# Patient Record
Sex: Female | Born: 1970 | Race: Black or African American | Hispanic: No | Smoking: Never smoker
Health system: Southern US, Community
[De-identification: ages and names within clinical notes are randomized; demographics above are authoritative.]

## PROBLEM LIST (undated history)

## (undated) DIAGNOSIS — A630 Anogenital (venereal) warts: Secondary | ICD-10-CM

## (undated) DIAGNOSIS — E282 Polycystic ovarian syndrome: Secondary | ICD-10-CM

## (undated) DIAGNOSIS — J45909 Unspecified asthma, uncomplicated: Secondary | ICD-10-CM

## (undated) DIAGNOSIS — D649 Anemia, unspecified: Secondary | ICD-10-CM

## (undated) HISTORY — DX: Polycystic ovarian syndrome: E28.2

## (undated) HISTORY — PX: HYSTEROSCOPY: SHX211

## (undated) HISTORY — DX: Anemia, unspecified: D64.9

## (undated) HISTORY — DX: Unspecified asthma, uncomplicated: J45.909

## (undated) HISTORY — DX: Anogenital (venereal) warts: A63.0

---

## 2006-06-12 ENCOUNTER — Encounter: Admission: RE | Admit: 2006-06-12 | Discharge: 2006-09-10 | Payer: Self-pay | Admitting: Obstetrics & Gynecology

## 2007-01-04 ENCOUNTER — Other Ambulatory Visit: Admission: RE | Admit: 2007-01-04 | Discharge: 2007-01-04 | Payer: Self-pay | Admitting: Obstetrics and Gynecology

## 2007-11-19 ENCOUNTER — Other Ambulatory Visit: Admission: RE | Admit: 2007-11-19 | Discharge: 2007-11-19 | Payer: Self-pay | Admitting: Obstetrics & Gynecology

## 2008-07-11 ENCOUNTER — Other Ambulatory Visit: Admission: RE | Admit: 2008-07-11 | Discharge: 2008-07-11 | Payer: Self-pay | Admitting: Obstetrics and Gynecology

## 2008-07-12 ENCOUNTER — Encounter: Payer: Self-pay | Admitting: Internal Medicine

## 2008-09-22 DIAGNOSIS — F329 Major depressive disorder, single episode, unspecified: Secondary | ICD-10-CM

## 2008-09-22 DIAGNOSIS — M545 Low back pain, unspecified: Secondary | ICD-10-CM | POA: Insufficient documentation

## 2008-09-22 DIAGNOSIS — F411 Generalized anxiety disorder: Secondary | ICD-10-CM | POA: Insufficient documentation

## 2008-09-24 ENCOUNTER — Ambulatory Visit: Payer: Self-pay | Admitting: Internal Medicine

## 2008-09-24 DIAGNOSIS — K921 Melena: Secondary | ICD-10-CM

## 2008-10-10 ENCOUNTER — Ambulatory Visit: Payer: Self-pay | Admitting: Internal Medicine

## 2010-05-12 ENCOUNTER — Encounter
Admission: RE | Admit: 2010-05-12 | Discharge: 2010-05-12 | Payer: Self-pay | Source: Home / Self Care | Admitting: Obstetrics & Gynecology

## 2013-02-16 ENCOUNTER — Ambulatory Visit (INDEPENDENT_AMBULATORY_CARE_PROVIDER_SITE_OTHER): Payer: BC Managed Care – PPO | Admitting: Family Medicine

## 2013-02-16 VITALS — BP 112/70 | HR 60 | Temp 99.5°F | Resp 16 | Ht 67.0 in | Wt 240.0 lb

## 2013-02-16 DIAGNOSIS — B373 Candidiasis of vulva and vagina: Secondary | ICD-10-CM

## 2013-02-16 DIAGNOSIS — Z131 Encounter for screening for diabetes mellitus: Secondary | ICD-10-CM

## 2013-02-16 DIAGNOSIS — R21 Rash and other nonspecific skin eruption: Secondary | ICD-10-CM

## 2013-02-16 LAB — POCT WET PREP WITH KOH: Yeast Wet Prep HPF POC: NEGATIVE

## 2013-02-16 LAB — GLUCOSE, POCT (MANUAL RESULT ENTRY): POC Glucose: 119 mg/dl — AB (ref 70–99)

## 2013-02-16 MED ORDER — FLUCONAZOLE 100 MG PO TABS: ORAL_TABLET | ORAL | Status: DC

## 2013-02-16 MED ORDER — NYSTATIN 100000 UNIT/GM EX CREA: TOPICAL_CREAM | Freq: Two times a day (BID) | CUTANEOUS | Status: DC

## 2013-02-16 NOTE — Progress Notes (Signed)
   [address]   [place], [place]  [address]   [phone]  Subjective:    Patient ID: Kim Webb, female    DOB: January 28, 1971, 42 y.o.   MRN: [id number]  Rash Pertinent negatives include no fatigue or fever.   This 42 y.o. female presents for evaluation of pruritic rash inner thighs.  Using Gold Bond to keep odor away for the past 1.5 weeks.  Onset of itching three days ago; changed to Gold Bond medicated cream with some improvement.  No vaginal discharge; married.  Worried about genital warts and herpes.    Review of Systems  Constitutional: Negative for fever, chills, diaphoresis and fatigue.  Genitourinary: Negative for vaginal bleeding, vaginal discharge, genital sores and vaginal pain.  Skin: Positive for rash.   History reviewed. No pertinent past medical history. History reviewed. No pertinent past surgical history. No Known Allergies No current outpatient prescriptions on file prior to visit.   No current facility-administered medications on file prior to visit.       Objective:   Physical Exam  Nursing note and vitals reviewed. Constitutional: She is oriented to person, place, and time.  Genitourinary:    There is rash on the right labia. There is no tenderness, lesion or injury on the right labia. There is rash on the left labia. There is no tenderness, lesion or injury on the left labia. No erythema around the vagina. No foreign body around the vagina. Vaginal discharge found.  Hypopigmented rash B introitus including labia majora; scaling slightly; +white thick discharge at vaginal opening. No vesicles or pustules along affected area.  +follicular prominence B inner thighs of unaffected area of rash.  Neurological: She is alert and oriented to person, place, and time.   Results for orders placed in visit on 02/16/13  POCT WET PREP WITH KOH      Result Value Range   Trichomonas, UA Negative     Clue Cells Wet Prep HPF POC tntc     Epithelial Wet Prep HPF POC  3-6     Yeast Wet Prep HPF POC neg     Bacteria Wet Prep HPF POC 3+     RBC Wet Prep HPF POC 2-3     WBC Wet Prep HPF POC 1-2     KOH Prep POC Negative    POCT SKIN KOH      Result Value Range   Skin KOH, POC Positive    GLUCOSE, POCT (MANUAL RESULT ENTRY)      Result Value Range   POC Glucose 119 (*) 70 - 99 mg/dl       Assessment & Plan:  Rash and nonspecific skin eruption - Plan: POCT Wet Prep with KOH, POCT Skin KOH  Candidiasis of vulva and vagina - Plan: POCT glucose (manual entry)  Screening for diabetes mellitus - Plan: POCT glucose (manual entry)  1.  Candidiasis vulvovaginal:  New.  Rx for Diflucan and Nystatin cream provided.  No evidence of genital herpes or genital warts.  Normal random glucose.  Meds ordered this encounter  Medications  . fluconazole (DIFLUCAN) 100 MG tablet    Sig: Two tablets daily x 1 day then one tablet daily x 6 days    Dispense:  8 tablet    Refill:  0  . nystatin cream (MYCOSTATIN)    Sig: Apply topically 2 (two) times daily.    Dispense:  30 g    Refill:  1

## 2013-04-02 ENCOUNTER — Ambulatory Visit (INDEPENDENT_AMBULATORY_CARE_PROVIDER_SITE_OTHER): Payer: BC Managed Care – PPO | Admitting: Family Medicine

## 2013-04-02 VITALS — BP 118/80 | HR 61 | Temp 98.6°F | Resp 16 | Ht 66.5 in | Wt 237.0 lb

## 2013-04-02 DIAGNOSIS — M79609 Pain in unspecified limb: Secondary | ICD-10-CM

## 2013-04-02 DIAGNOSIS — L02419 Cutaneous abscess of limb, unspecified: Secondary | ICD-10-CM

## 2013-04-02 DIAGNOSIS — L03115 Cellulitis of right lower limb: Secondary | ICD-10-CM

## 2013-04-02 MED ORDER — DOXYCYCLINE HYCLATE 100 MG PO TABS: 100.0000 mg | ORAL_TABLET | Freq: Two times a day (BID) | ORAL | Status: DC

## 2013-04-02 NOTE — Progress Notes (Signed)
Subjective:    Patient ID: Kim Webb, female    DOB: [birth year], 42 y.o.   MRN: [id number] This chart was scribed for Kim Staggers, MD by Kim Webb, ED Scribe. This patient was seen in room 3 and the patient's care was started at 9:13 PM.  Chief Complaint  Patient presents with  . Cellulitis    Right thigh since last night   HPI HPI Comments: Kim Webb is a 42 y.o. female who presents to the [facility] complaining of cellulitis with redness to the right thigh since last night. She states she was standing looking in the mirror last night when she noticed a bulging area and became concerned. The area became mildly tender this morning. She did not notice any soreness until last night. Her son has a staph infection. She has not tried any creams or medications. She denies rash anywhere else. She denies fever, chills, recent illness.   Patient Active Problem List   Diagnosis Date Noted  . HEMOCCULT POSITIVE STOOL 09/24/2008  . ANXIETY 09/22/2008  . DEPRESSION 09/22/2008  . BACK PAIN, LUMBAR 09/22/2008   History reviewed. No pertinent past medical history. History reviewed. No pertinent past surgical history. No Known Allergies Prior to Admission medications   Not on File   History  Substance Use Topics  . Smoking status: Never Smoker   . Smokeless tobacco: Not on file  . Alcohol Use: No     Review of Systems  Constitutional: Negative for fever and chills.  Skin: Negative for rash.       Objective:   Physical Exam  Nursing note and vitals reviewed. Constitutional: She is oriented to person, place, and time. She appears well-developed and well-nourished. No distress.  HENT:  Head: Normocephalic and atraumatic.  Eyes: EOM are normal.  Cardiovascular: Normal rate, regular rhythm, normal heart sounds and intact distal pulses.   Pulmonary/Chest: Effort normal and breath sounds normal. No respiratory distress. She has no wheezes. She has  no rales.  Neurological: She is alert and oriented to person, place, and time.  Skin: Skin is warm and dry. There is erythema.  Medial right thigh there is a slight hyperpigmentation, induration of approx 3-4cm with one central pustule and a central fluctuance of approx 1 cm, 15 cm of faint erythema around indurated area. No palpable tenderness or lymphadenopathy of right inguinal area.  Psychiatric: She has a normal mood and affect. Her behavior is normal.    Filed Vitals:   04/02/13 2018  BP: 118/80  Pulse: 61  Temp: 98.6 F (37 C)  Resp: 16  Height: 5' 6.5" (1.689 m)  Weight: 237 lb (107.502 kg)  SpO2: 100%       Assessment & Plan:   Kim Webb is a 42 y.o. female Cellulitis of leg, right - Plan: doxycycline (VIBRA-TABS) 100 MG tablet, Wound culture  Minimal d/c obtained with I and D. Start doxycycline, wound cx pending, wound care recheck in 3 days. Hot compresses b/t now and then. rtc precautions.  Meds ordered this encounter  Medications  . doxycycline (VIBRA-TABS) 100 MG tablet    Sig: Take 1 tablet (100 mg total) by mouth 2 (two) times daily.    Dispense:  20 tablet    Refill:  0   Patient Instructions  Hot compresses to affected area 4-5 times per day, start antibiotic, and recheck Friday morning with Kim Webb.  Change the dressing once daily (leave the packing in place).  Change the dressing more  frequently if it becomes wet or saturated. Return to the clinic or go to the nearest emergency room if any of your symptoms worsen or new symptoms occur. Cellulitis Cellulitis is an infection of the skin and the tissue beneath it. The infected area is usually red and tender. Cellulitis occurs most often in the arms and lower legs.  CAUSES  Cellulitis is caused by bacteria that enter the skin through cracks or cuts in the skin. The most common types of bacteria that cause cellulitis are Staphylococcus and Streptococcus. SYMPTOMS   Redness and  warmth.  Swelling.  Tenderness or pain.  Fever. DIAGNOSIS  Your caregiver can usually determine what is wrong based on a physical exam. Blood tests may also be done. TREATMENT  Treatment usually involves taking an antibiotic medicine. HOME CARE INSTRUCTIONS   Take your antibiotics as directed. Finish them even if you start to feel better.  Keep the infected arm or leg elevated to reduce swelling.  Apply a warm cloth to the affected area up to 4 times per day to relieve pain.  Only take over-the-counter or prescription medicines for pain, discomfort, or fever as directed by your caregiver.  Keep all follow-up appointments as directed by your caregiver. SEEK MEDICAL CARE IF:   You notice red streaks coming from the infected area.  Your red area gets larger or turns dark in color.  Your bone or joint underneath the infected area becomes painful after the skin has healed.  Your infection returns in the same area or another area.  You notice a swollen bump in the infected area.  You develop new symptoms. SEEK IMMEDIATE MEDICAL CARE IF:   You have a fever.  You feel very sleepy.  You develop vomiting or diarrhea.  You have a general ill feeling (malaise) with muscle aches and pains. MAKE SURE YOU:   Understand these instructions.  Will watch your condition.  Will get help right away if you are not doing well or get worse. Document Released: 03/02/2005 Document Revised: 11/22/2011 Document Reviewed: 08/08/2011 [facility] Patient Information 2014 [place], [place].      I personally performed the services described in this documentation, which was scribed in my presence. The recorded information has been reviewed and considered, and addended by me as needed.

## 2013-04-02 NOTE — Patient Instructions (Addendum)
Hot compresses to affected area 4-5 times per day, start antibiotic, and recheck Friday morning with Dr. Neva Seat.  Change the dressing once daily (leave the packing in place).  Change the dressing more frequently if it becomes wet or saturated. Return to the clinic or go to the nearest emergency room if any of your symptoms worsen or new symptoms occur. Cellulitis Cellulitis is an infection of the skin and the tissue beneath it. The infected area is usually red and tender. Cellulitis occurs most often in the arms and lower legs.  CAUSES  Cellulitis is caused by bacteria that enter the skin through cracks or cuts in the skin. The most common types of bacteria that cause cellulitis are Staphylococcus and Streptococcus. SYMPTOMS   Redness and warmth.  Swelling.  Tenderness or pain.  Fever. DIAGNOSIS  Your caregiver can usually determine what is wrong based on a physical exam. Blood tests may also be done. TREATMENT  Treatment usually involves taking an antibiotic medicine. HOME CARE INSTRUCTIONS   Take your antibiotics as directed. Finish them even if you start to feel better.  Keep the infected arm or leg elevated to reduce swelling.  Apply a warm cloth to the affected area up to 4 times per day to relieve pain.  Only take over-the-counter or prescription medicines for pain, discomfort, or fever as directed by your caregiver.  Keep all follow-up appointments as directed by your caregiver. SEEK MEDICAL CARE IF:   You notice red streaks coming from the infected area.  Your red area gets larger or turns dark in color.  Your bone or joint underneath the infected area becomes painful after the skin has healed.  Your infection returns in the same area or another area.  You notice a swollen bump in the infected area.  You develop new symptoms. SEEK IMMEDIATE MEDICAL CARE IF:   You have a fever.  You feel very sleepy.  You develop vomiting or diarrhea.  You have a general ill  feeling (malaise) with muscle aches and pains. MAKE SURE YOU:   Understand these instructions.  Will watch your condition.  Will get help right away if you are not doing well or get worse. Document Released: 03/02/2005 Document Revised: 11/22/2011 Document Reviewed: 08/08/2011 [facility] Patient Information 2014 [place], [place].

## 2013-04-02 NOTE — Progress Notes (Signed)
Procedure Note: Verbal consent obtained.  Local anesthesia with 2 cc 2% lidocaine.  Betadine prep.  Incision with 11 blade.  Minimal purulence expressed.  Culture collected. Wound irrigated with remaining anesthetic.  Packed with 1/4 inch plain packing.  Cleansed and dressed.  Discussed wound care.

## 2013-04-05 ENCOUNTER — Ambulatory Visit (INDEPENDENT_AMBULATORY_CARE_PROVIDER_SITE_OTHER): Payer: BC Managed Care – PPO | Admitting: Family Medicine

## 2013-04-05 VITALS — BP 120/68 | HR 74 | Temp 98.9°F | Resp 16 | Ht 66.25 in | Wt 234.6 lb

## 2013-04-05 DIAGNOSIS — L02419 Cutaneous abscess of limb, unspecified: Secondary | ICD-10-CM

## 2013-04-05 LAB — WOUND CULTURE: Gram Stain: NONE SEEN

## 2013-04-05 NOTE — Progress Notes (Signed)
  Subjective:    Patient ID: Kim Webb, female    DOB: July 11, 1970, 42 y.o.   MRN: [id number]  HPI  Kim Webb is a 42 y.o. female  Here for follow up R thigh cellulitis - s/p I and D 04/02/13, and Rx doxycycline 100mg  BID. Wound cx below.   Results for orders placed in visit on 04/02/13  WOUND CULTURE      Result Value Range   Gram Stain Rare     Gram Stain WBC present-predominately PMN     Gram Stain No Squamous Epithelial Cells Seen     Gram Stain Few GRAM POSITIVE COCCI IN PAIRS In Clusters     Organism ID, Bacteria Multiple Organisms Present,None Predominant     Feels like is getting better. Changing bandage multiple times as won't stick. No fever, no discharge. No apparent redness. Feels well.    Review of Systems  Constitutional: Negative for fever, chills and fatigue.  Gastrointestinal: Negative for nausea, vomiting, diarrhea and blood in stool.  Skin: Negative for color change, rash and wound.       Objective:   Physical Exam  Vitals reviewed. Constitutional: She is oriented to person, place, and time. She appears well-developed and well-nourished. No distress.  Pulmonary/Chest: Effort normal.  Musculoskeletal:       Legs: Neurological: She is alert and oriented to person, place, and time.  Skin: Skin is warm and dry.  Psychiatric: She has a normal mood and affect. Her behavior is normal.       Assessment & Plan:  Kim Webb is a 42 y.o. female Cellulitis and abscess of leg Improving.  No dominant bacteria on culture. Finish antibiotic, warm compresses and rtc precautions discussed.  If not continuing to improve in next 4-5 days - rtc for recheck. rtc precautions.   No orders of the defined types were placed in this encounter.   Patient Instructions  Continue warm compresses few times per day, complete course of antibiotic, and if not continuing to improve (or worsening) in next 4-5 days - recheck for repeat evaluation.

## 2013-04-05 NOTE — Patient Instructions (Signed)
Continue warm compresses few times per day, complete course of antibiotic, and if not continuing to improve (or worsening) in next 4-5 days - recheck for repeat evaluation.

## 2013-06-26 ENCOUNTER — Telehealth: Payer: Self-pay | Admitting: Obstetrics & Gynecology

## 2013-06-26 NOTE — Telephone Encounter (Signed)
Patient wants to see dr Hyacinth Meekermiller about her history with BV wants to get checked to see if anything is going on right now. And talk to her about it to see if that is what it really is. Wants to come in Monday if possible Call work and ask for her. Dropped cell in water.

## 2013-06-26 NOTE — Telephone Encounter (Signed)
Spoke with patient. She was currently under treatment for BV but did not finish oral flagyl as she feels it was causing her headaches. Now feels she may have developed a yeast infection. Office visit with Dr. Hyacinth MeekerMiller scheduled at patient's request for Monday 07/01/13.  Routing to provider for final review. Patient agreeable to disposition. Will close encounter

## 2013-06-28 ENCOUNTER — Encounter: Payer: Self-pay | Admitting: Obstetrics & Gynecology

## 2013-07-01 ENCOUNTER — Ambulatory Visit (INDEPENDENT_AMBULATORY_CARE_PROVIDER_SITE_OTHER): Payer: BC Managed Care – PPO | Admitting: Obstetrics & Gynecology

## 2013-07-01 ENCOUNTER — Other Ambulatory Visit: Payer: Self-pay | Admitting: Obstetrics & Gynecology

## 2013-07-01 ENCOUNTER — Ambulatory Visit: Payer: BC Managed Care – PPO | Admitting: Obstetrics & Gynecology

## 2013-07-01 ENCOUNTER — Ambulatory Visit
Admission: RE | Admit: 2013-07-01 | Discharge: 2013-07-01 | Disposition: A | Discharge status: Home / Self Care | Payer: BC Managed Care – PPO | Source: Ambulatory Visit | Attending: Obstetrics & Gynecology | Admitting: Obstetrics & Gynecology

## 2013-07-01 VITALS — BP 122/76 | HR 60 | Resp 16 | Ht 67.5 in | Wt 242.8 lb

## 2013-07-01 DIAGNOSIS — B373 Candidiasis of vulva and vagina: Secondary | ICD-10-CM

## 2013-07-01 DIAGNOSIS — N898 Other specified noninflammatory disorders of vagina: Secondary | ICD-10-CM

## 2013-07-01 DIAGNOSIS — Z202 Contact with and (suspected) exposure to infections with a predominantly sexual mode of transmission: Secondary | ICD-10-CM

## 2013-07-01 DIAGNOSIS — B3731 Acute candidiasis of vulva and vagina: Secondary | ICD-10-CM

## 2013-07-01 DIAGNOSIS — Z1231 Encounter for screening mammogram for malignant neoplasm of breast: Secondary | ICD-10-CM

## 2013-07-01 MED ORDER — FLUCONAZOLE 150 MG PO TABS: 150.0000 mg | ORAL_TABLET | Freq: Once | ORAL | Status: DC

## 2013-07-01 NOTE — Progress Notes (Signed)
Subjective:     Patient ID: Kim Webb, female   DOB: 09/24/1970, 43 y.o.   MRN: [id number]  HPI 43 yo G1 MAAF here for discussion of recurrent vaginal discharge and odor.  Patient has 601 1/43 year old son.  So happy with this.  Husband with indiscretions in marriage.  She explains situation to me in detail that I don't feel she needs to share but she wants to explain why she is still in the marriage.  I explain to her this is ultimately her decision and not a decision she needs to justify.  She does not feel she needs therapy at this time.  Working together with spouse to dealt with issues.  She does report she is very anxious about this and wants to have STD testing done.  Very nervous.  Has been treated for yeast and BV.  Given flagyl for 10 days but made her sick so she only took 7 days of it.  Wonders if she had enough.  Cycles regular.  Mild odor.  No pelvic pain.  No fever.  Review of Systems  All other systems reviewed and are negative.       Objective:   Physical Exam  Constitutional: She is oriented to person, place, and time. She appears well-developed and well-nourished.  Abdominal: Soft. Bowel sounds are normal.  Genitourinary: Uterus normal. There is no rash or tenderness on the right labia. There is no rash or tenderness on the left labia. Cervix exhibits discharge. Cervix exhibits no motion tenderness and no friability. Right adnexum displays no mass and no tenderness. Left adnexum displays no mass and no tenderness. No tenderness or bleeding around the vagina. No foreign body around the vagina. Vaginal discharge found.  Musculoskeletal: Normal range of motion.  Lymphadenopathy:       Right: No inguinal adenopathy present.       Left: No inguinal adenopathy present.  Neurological: She is alert and oriented to person, place, and time.  Skin: Skin is warm and dry.  Psychiatric: She has a normal mood and affect.   Wet smear:  Ph 5.0.  Saline with - trich, +1WBCs, KOH with +  yeast, neg Whiff    Assessment:     Vaginal discharge Desires STD testing     Plan:     HIV, RPR, Hep b, GC/Chl today Diflucan 150mg  po x 1 and repeat 72 hours.  Rx to pharmacy.      ~25 minutes spent with patient >50% of time was in face to face discussion of above.  Lengthy visit as patient explained her situation to me and felt she needed to share so I listened.

## 2013-07-02 LAB — STD PANEL
HEP B S AG: NEGATIVE
HIV: NONREACTIVE

## 2013-07-02 LAB — GC/CHLAMYDIA PROBE AMP, URINE
CHLAMYDIA, SWAB/URINE, PCR: NEGATIVE
GC PROBE AMP, URINE: NEGATIVE

## 2013-07-05 ENCOUNTER — Encounter: Payer: Self-pay | Admitting: Obstetrics & Gynecology

## 2013-07-05 ENCOUNTER — Telehealth: Payer: Self-pay | Admitting: Emergency Medicine

## 2013-07-05 NOTE — Telephone Encounter (Signed)
Message copied by Joeseph AmorFAST, Parissa Chiao L on Fri Jul 05, 2013  9:53 AM ------      Message from: Jerene BearsMILLER, MARY S      Created: Wed Jul 03, 2013  3:51 PM       Please inform HIV, RPR, Hep B, GC, and Chl all neg.  Thank you.  Husband has extra-marital relationship and this was for pt's reassurance. ------

## 2013-07-05 NOTE — Telephone Encounter (Signed)
Spoke with patient and message from Dr. Hyacinth MeekerMiller given. Patient very grateful for phone call.  Patient states that she took both doses of diflucan and was wearing a girdle and noticed itching again. Wanted to know if she should try something over the counter. Advised that she just completed the two tablets of diflucan yesterday, that it may take some time to continue to work, but to call back if not improved. Patient agreeable.  Routing to provider for final review. Patient agreeable to disposition. Will close encounter

## 2013-07-08 ENCOUNTER — Encounter: Payer: Self-pay | Admitting: Obstetrics & Gynecology

## 2013-08-19 ENCOUNTER — Ambulatory Visit (INDEPENDENT_AMBULATORY_CARE_PROVIDER_SITE_OTHER): Payer: BC Managed Care – PPO | Admitting: Internal Medicine

## 2013-08-19 VITALS — BP 120/78 | HR 68 | Temp 99.2°F | Resp 18 | Ht 65.5 in | Wt 240.6 lb

## 2013-08-19 DIAGNOSIS — N898 Other specified noninflammatory disorders of vagina: Secondary | ICD-10-CM

## 2013-08-19 LAB — GLUCOSE, POCT (MANUAL RESULT ENTRY): POC GLUCOSE: 105 mg/dL — AB (ref 70–99)

## 2013-08-19 LAB — POCT WET PREP WITH KOH
KOH PREP POC: NEGATIVE
RBC WET PREP PER HPF POC: NEGATIVE
Trichomonas, UA: NEGATIVE
Yeast Wet Prep HPF POC: NEGATIVE

## 2013-08-19 MED ORDER — FLUCONAZOLE 150 MG PO TABS: 150.0000 mg | ORAL_TABLET | Freq: Once | ORAL | Status: DC

## 2013-08-19 NOTE — Progress Notes (Signed)
   Subjective:    Patient ID: Kim Webb, female    DOB: [birth year], 43 y.o.   MRN: [id number]  HPI 43 year old woman with cc of vaginal discharge and itch and wants to be checked for std. No known std exposure although her sexual partner is not always loyal. Uses condoms. Has been checked for std within the year including negative hiv test. Has had a recent treatment for bacterial vaginosis with flagyl 2 weeks ago was [place]. No abdominal or pelvic pain. No fever. Not pregnant.    Review of Systems  Genitourinary: Positive for vaginal discharge.  All other systems reviewed and are negative.       Objective:   Physical Exam  Nursing note and vitals reviewed. Constitutional: She is oriented to person, place, and time. She appears well-developed and well-nourished.  HENT:  Head: Normocephalic and atraumatic.  Eyes: Conjunctivae and EOM are normal. Pupils are equal, round, and reactive to light.  Neck: Normal range of motion. Neck supple.  Cardiovascular: Normal rate, regular rhythm, normal heart sounds and intact distal pulses.   Pulmonary/Chest: Effort normal and breath sounds normal.  Abdominal: Soft. Bowel sounds are normal.  Genitourinary: Vaginal discharge found.  Gyn exam normal introitus with vaginal discharge white thick like yeast no cervical motion tenderness no adnexal tenderness.   Musculoskeletal: Normal range of motion.  Neurological: She is oriented to person, place, and time.  Skin: Skin is warm and dry.  Psychiatric: She has a normal mood and affect. Her behavior is normal. Judgment and thought content normal.   Results for orders placed in visit on 08/19/13  POCT WET PREP WITH KOH      Result Value Ref Range   Trichomonas, UA Negative     Clue Cells Wet Prep HPF POC 0-2     Epithelial Wet Prep HPF POC 3-5     Yeast Wet Prep HPF POC neg     Bacteria Wet Prep HPF POC 1+     RBC Wet Prep HPF POC neg     WBC Wet Prep HPF POC 2-4      KOH Prep POC Negative     negative yeast however appears as yeast infection and is symptomatic . Will rx with diflucan.await the results for chlamydia and gc.       Assessment & Plan:  Diflucan as directed. Use condoms. fup as directed. gc and chlamydia sent to lab.

## 2013-08-19 NOTE — Progress Notes (Signed)
   Subjective:    Patient ID: Kim Webb, female    DOB: 07/15/1970, 43 y.o.   MRN: [id number]  HPI    Review of Systems     Objective:   Physical Exam   Results for orders placed in visit on 08/19/13  POCT WET PREP WITH KOH      Result Value Ref Range   Trichomonas, UA Negative     Clue Cells Wet Prep HPF POC 0-2     Epithelial Wet Prep HPF POC 3-5     Yeast Wet Prep HPF POC neg     Bacteria Wet Prep HPF POC 1+     RBC Wet Prep HPF POC neg     WBC Wet Prep HPF POC 2-4     KOH Prep POC Negative    GLUCOSE, POCT (MANUAL RESULT ENTRY)      Result Value Ref Range   POC Glucose 105 (*) 70 - 99 mg/dl      accucheck is [id number] nonfasting Assessment & Plan:

## 2013-08-19 NOTE — Addendum Note (Signed)
Addended by: Sherlyn HayGOTTLIEB, Siris Hoos L on: 08/19/2013 09:19 PM   Modules accepted: Orders

## 2013-08-19 NOTE — Patient Instructions (Signed)
Take meds as directed. The rest of the tests for std have been sent out to the lab and will take 3 days to result. Use condoms. If your symptoms worsen or if you develop any new symptoms return to the office.

## 2013-08-21 LAB — GC/CHLAMYDIA PROBE AMP
CT Probe RNA: NEGATIVE
GC PROBE AMP APTIMA: NEGATIVE

## 2013-08-21 NOTE — Addendum Note (Signed)
Addended by: Ethelda ChickSMITH, Cairo Agostinelli M on: 08/21/2013 01:52 PM   Modules accepted: Level of Service

## 2014-02-24 ENCOUNTER — Telehealth: Payer: Self-pay | Admitting: Obstetrics & Gynecology

## 2014-02-24 NOTE — Telephone Encounter (Signed)
Pt has an issue and she would like to see Dr Hyacinth Meeker.

## 2014-02-24 NOTE — Telephone Encounter (Signed)
Spoke with patient. Patient states that she was recently diagnosed with folliculitis. States that she has this on her inner thigh and it has gotten more frequent over the past 3 months. Patient now has a "cyst" that is causing her discomfort. "I want to come in to see Dr.Miller so she can take a look at everything and maybe do some blood work to see what is going on." Patient states that she is not in any pain currently. Appointment scheduled for tomorrow at 8:45am with Dr.Miller (time per Kennon Rounds). Patient agreeable to date and time.  Routing to provider for final review. Patient agreeable to disposition. Will close encounter

## 2014-02-25 ENCOUNTER — Telehealth: Payer: Self-pay | Admitting: Obstetrics & Gynecology

## 2014-02-25 ENCOUNTER — Ambulatory Visit: Payer: BC Managed Care – PPO | Admitting: Obstetrics & Gynecology

## 2014-02-25 ENCOUNTER — Ambulatory Visit (INDEPENDENT_AMBULATORY_CARE_PROVIDER_SITE_OTHER): Payer: BC Managed Care – PPO | Admitting: Family Medicine

## 2014-02-25 VITALS — BP 130/80 | HR 70 | Temp 98.0°F | Resp 17 | Ht 67.5 in | Wt 242.0 lb

## 2014-02-25 DIAGNOSIS — L678 Other hair color and hair shaft abnormalities: Secondary | ICD-10-CM

## 2014-02-25 DIAGNOSIS — L738 Other specified follicular disorders: Secondary | ICD-10-CM

## 2014-02-25 DIAGNOSIS — K140 Glossitis: Secondary | ICD-10-CM

## 2014-02-25 DIAGNOSIS — L739 Follicular disorder, unspecified: Secondary | ICD-10-CM

## 2014-02-25 MED ORDER — DOXYCYCLINE HYCLATE 100 MG PO TABS: 100.0000 mg | ORAL_TABLET | Freq: Two times a day (BID) | ORAL | Status: DC

## 2014-02-25 NOTE — Progress Notes (Signed)
This is a 43 year old woman from [facility] who works at [employer]. and T. she comes in with a sore on the right tongue, and her chronic groin and axillary folliculitis.  The sore on her right tongue has gotten better but she now has a white spot and she's worried about having cancer on her tongue. She does not smoke or chew tobacco.  She is to-year-old son in toe.  Patient has had multiple small boils on both medial thighs as well as her right axilla.  Objective: Patient has a 1-2 mm white healing ulcer on the tip of her tongue. She has no cervical adenopathy. There are no other oral lesions.  Patient does have hypopigmented resolving follicles in her groin as well as some active small boils.  Assessment: Recent ulcer on her tip of her tongue. This seems to be healing normally.  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 At any rate these boils are small and should respond to tetracycline.  What I suggest to her is that we take 10 days of the tetracycline to see if we can clear up the acute infection. She may need chronic antibiotic suppression for the recurrent boils but this remains to be seen.  In addition we'll go ahead and set her up for physical.  Signed, Elvina Sidle

## 2014-02-25 NOTE — Patient Instructions (Signed)

## 2014-02-25 NOTE — Telephone Encounter (Signed)
Patient came into office and spoke to receptionist requesting to speak with Dr Hyacinth Meeker who she feels would understand her problem. Patient canceled appointment that was scheduled for 2pm today, has her son with her. Brought patient and son to my office. Patient  expressing frustration that she has tried so hard to get to see Dr Hyacinth Meeker today but was unable to see her due to traffic issues making her late and does not have childcare. States she is recently separated, concerned about mouth sore on tongue. Denies GYN issues. States she is concernd it could be herpes. Has not had this issue before.  Patient's son very active in my office, touching call lights, climbing under chair, crying. Explained to patient that in order to give her problem the time and attention it deserves, must adhere to office policy and time restraints for late appointments. Additionally, she would not be able to have conversation with MD while also caring for her active son. Exam rooms would not be a safe place for son to be touching and playing on floor. Discussed that our office may not be best place to assess mouth sores, recommend PCP. Called Dr [facility] office, offered appointment tomorrow but patient declines due to work. Scheduled for 03-06-14 at 230. Patient asking date of last AEX. Advised last seen here 06-2014 but not for AEX, was problem visit. Last AEX was prior to [facility] conversion, in old paper chart (pre 08-2012). Patient declines to schedule, will call back.  Apologized for patients experience today.     While checking to see date of last AEX, noticed patient had appointment today at family medicine at 1115.Call to patient, advised appointment scheduled for next week is with a PCP (family medicine). She states she was seen at urgent care today to get some relief for mouth ulcer but she still wants the appointment with PCP to discuss additional testing for herpes and/cancer. Concerned about what is causing this and states  urgent care is just treating it without finding out the cause. Demographics and insurance card faxed to dr [facility] office.  Routing to provider for final review. Patient agreeable to disposition. Will close encounter

## 2014-02-25 NOTE — Telephone Encounter (Signed)
Pt arrived 30 minutes late for her problem visit. She did come with her baby in a stroller he appeared to be 43 years old or younger. She was rescheduled to 2:00 pm today. I called her to make sure she would have someone to watch her son while she was in for her appointment. She stated she is a single mom and she drove 2 hours to get here. She does not have anyone to watch her baby so she canceled her appointment for today and states she will be going to another physician instead. Patient is requesting her medical records as well.

## 2014-02-25 NOTE — Telephone Encounter (Signed)
Patient wants to know when she had her last pap smear done.

## 2014-03-06 ENCOUNTER — Telehealth: Payer: Self-pay | Admitting: Obstetrics & Gynecology

## 2014-05-08 ENCOUNTER — Emergency Department (HOSPITAL_COMMUNITY)
Admission: EM | Admit: 2014-05-08 | Discharge: 2014-05-09 | Disposition: A | Discharge status: Home / Self Care | Payer: BC Managed Care – PPO | Attending: Emergency Medicine | Admitting: Emergency Medicine

## 2014-05-08 ENCOUNTER — Emergency Department (HOSPITAL_COMMUNITY): Payer: BC Managed Care – PPO

## 2014-05-08 ENCOUNTER — Encounter (HOSPITAL_COMMUNITY): Payer: Self-pay | Admitting: Nurse Practitioner

## 2014-05-08 DIAGNOSIS — R1033 Periumbilical pain: Secondary | ICD-10-CM | POA: Insufficient documentation

## 2014-05-08 DIAGNOSIS — J45909 Unspecified asthma, uncomplicated: Secondary | ICD-10-CM | POA: Diagnosis not present

## 2014-05-08 DIAGNOSIS — R42 Dizziness and giddiness: Secondary | ICD-10-CM | POA: Insufficient documentation

## 2014-05-08 DIAGNOSIS — R112 Nausea with vomiting, unspecified: Secondary | ICD-10-CM | POA: Diagnosis not present

## 2014-05-08 DIAGNOSIS — R197 Diarrhea, unspecified: Secondary | ICD-10-CM | POA: Insufficient documentation

## 2014-05-08 DIAGNOSIS — Z862 Personal history of diseases of the blood and blood-forming organs and certain disorders involving the immune mechanism: Secondary | ICD-10-CM | POA: Diagnosis not present

## 2014-05-08 DIAGNOSIS — Z8639 Personal history of other endocrine, nutritional and metabolic disease: Secondary | ICD-10-CM | POA: Diagnosis not present

## 2014-05-08 DIAGNOSIS — Z8619 Personal history of other infectious and parasitic diseases: Secondary | ICD-10-CM | POA: Diagnosis not present

## 2014-05-08 DIAGNOSIS — R1013 Epigastric pain: Secondary | ICD-10-CM | POA: Insufficient documentation

## 2014-05-08 DIAGNOSIS — R109 Unspecified abdominal pain: Secondary | ICD-10-CM

## 2014-05-08 DIAGNOSIS — Z91040 Latex allergy status: Secondary | ICD-10-CM | POA: Insufficient documentation

## 2014-05-08 DIAGNOSIS — R1031 Right lower quadrant pain: Secondary | ICD-10-CM | POA: Diagnosis not present

## 2014-05-08 DIAGNOSIS — Z79899 Other long term (current) drug therapy: Secondary | ICD-10-CM | POA: Diagnosis not present

## 2014-05-08 LAB — COMPREHENSIVE METABOLIC PANEL
ALT: 13 U/L (ref 0–35)
AST: 19 U/L (ref 0–37)
Albumin: 3.9 g/dL (ref 3.5–5.2)
Alkaline Phosphatase: 69 U/L (ref 39–117)
Anion gap: 13 (ref 5–15)
BUN: 12 mg/dL (ref 6–23)
CO2: 23 mEq/L (ref 19–32)
Calcium: 9.8 mg/dL (ref 8.4–10.5)
Chloride: 102 mEq/L (ref 96–112)
Creatinine, Ser: 0.81 mg/dL (ref 0.50–1.10)
GFR calc Af Amer: >90 mL/min (ref >90)
GFR calc non Af Amer: 88 mL/min — ABNORMAL LOW (ref >90)
Glucose, Bld: 92 mg/dL (ref 70–99)
Potassium: 4.3 mEq/L (ref 3.7–5.3)
Sodium: 138 mEq/L (ref 137–147)
Total Bilirubin: 0.2 mg/dL — ABNORMAL LOW (ref 0.3–1.2)
Total Protein: 7.4 g/dL (ref 6.0–8.3)

## 2014-05-08 LAB — CBC WITH DIFFERENTIAL/PLATELET
Basophils Absolute: 0 10*3/uL (ref 0.0–0.1)
Basophils Relative: 0 % (ref 0–1)
Eosinophils Absolute: 0.1 10*3/uL (ref 0.0–0.7)
Eosinophils Relative: 1 % (ref 0–5)
HCT: 39.3 % (ref 36.0–46.0)
Hemoglobin: 13.1 g/dL (ref 12.0–15.0)
Lymphocytes Relative: 7 % — ABNORMAL LOW (ref 12–46)
Lymphs Abs: 1.2 10*3/uL (ref 0.7–4.0)
MCH: 30.8 pg (ref 26.0–34.0)
MCHC: 33.3 g/dL (ref 30.0–36.0)
MCV: 92.5 fL (ref 78.0–100.0)
Monocytes Absolute: 1.3 10*3/uL — ABNORMAL HIGH (ref 0.1–1.0)
Monocytes Relative: 8 % (ref 3–12)
Neutro Abs: 14 10*3/uL — ABNORMAL HIGH (ref 1.7–7.7)
Neutrophils Relative %: 84 % — ABNORMAL HIGH (ref 43–77)
Platelets: 201 10*3/uL (ref 150–400)
RBC: 4.25 MIL/uL (ref 3.87–5.11)
RDW: 14.1 % (ref 11.5–15.5)
WBC: 16.7 10*3/uL — ABNORMAL HIGH (ref 4.0–10.5)

## 2014-05-08 LAB — LIPASE, BLOOD: Lipase: 47 U/L (ref 11–59)

## 2014-05-08 MED ORDER — HYDROMORPHONE HCL 1 MG/ML IJ SOLN
1.0000 mg | Freq: Once | INTRAMUSCULAR | Status: AC
  Administered 2014-05-08: 0.5 mg via INTRAVENOUS

## 2014-05-08 MED ORDER — SODIUM CHLORIDE 0.9 % IV BOLUS (SEPSIS)
1000.0000 mL | Freq: Once | INTRAVENOUS | Status: AC
  Administered 2014-05-08: 1000 mL via INTRAVENOUS

## 2014-05-08 MED ORDER — HYDROMORPHONE HCL 1 MG/ML IJ SOLN
INTRAMUSCULAR | Status: AC
  Filled 2014-05-08: qty 1

## 2014-05-08 MED ORDER — ONDANSETRON HCL 4 MG/2ML IJ SOLN
4.0000 mg | Freq: Once | INTRAMUSCULAR | Status: AC
  Administered 2014-05-08: 4 mg via INTRAVENOUS

## 2014-05-08 MED ORDER — IOHEXOL 300 MG/ML  SOLN
100.0000 mL | Freq: Once | INTRAMUSCULAR | Status: AC | PRN
  Administered 2014-05-08: 100 mL via INTRAVENOUS

## 2014-05-08 MED ORDER — ONDANSETRON HCL 4 MG/2ML IJ SOLN
INTRAMUSCULAR | Status: AC
  Filled 2014-05-08: qty 2

## 2014-05-08 MED ORDER — IOHEXOL 300 MG/ML  SOLN
25.0000 mL | Freq: Once | INTRAMUSCULAR | Status: AC | PRN
  Administered 2014-05-08: 25 mL via ORAL

## 2014-05-08 NOTE — ED Notes (Signed)
PA-C Student at bedside.

## 2014-05-08 NOTE — ED Notes (Signed)
Per EMS, pt had sudden onset of RUQ ABD pain. Pt had one occurrence of n/v prior to EMS arrival. No IV established or meds given enroute.

## 2014-05-08 NOTE — ED Notes (Signed)
Bed: [id number] Expected date:  Expected time:  Means of arrival:  Comments: EMS 43 yo female with diffuse upper abdominal pain

## 2014-05-08 NOTE — ED Provider Notes (Signed)
CSN: [id number]     Arrival date & time 05/08/14  2135 History   First MD Initiated Contact with Patient 05/08/14 2147     Chief Complaint  Patient presents with  . Abdominal Pain     HPI: Kim Webb is a 43 year old African American female with PMH of PCOS and Asthma who presents to the ED on 05/08/2014 for sudden onset abdominal pain that started at approximately 8:00 PM this evening. She was standing when she started to experience a "swelling/cramping" sensation then had a shooting pain in her umbilical region. She says the pain was an 8/10 at that time. She says the pain was so intense, she became lightheaded and had to sit down. Within the next hour, the pain decreased in intensity but went to her epigastric region and has been a 3/10 since then. She experienced an episode of vomiting but denies any hematemesis. She also had one episode of diarrhea within the past hour and denies any hematochezia and melena. Her last meal was around noon and consisted of a hamburger, salad, and chips. She reports feeling fine recently, up until her episode this evening. She denies any new medications except starting a Probiotic 4 days ago. Her LMP was November 28th and was 4 days in duration. PMH is significant for polyps removed in her fallopian tubes but no other pelvic or abdominal procedures. She is G1P1, and her child is currently 43 years old.  Past Medical History  Diagnosis Date  . Asthma   . Genital warts   . PCOS (polycystic ovarian syndrome)   . Anemia    Past Surgical History  Procedure Laterality Date  . Hysteroscopy      with polyp excision   Family History  Problem Relation Age of Onset  . Prostate cancer Father   . Prostate cancer Maternal Grandfather   . Hypertension Paternal Grandmother   . Cancer Paternal Grandfather   . Diabetes Father   . Hypertension Father   . CVA Paternal Grandmother   . Heart disease Father    History  Substance Use Topics  . Smoking status: Never  Smoker   . Smokeless tobacco: Never Used  . Alcohol Use: No   OB History    No data available     Review of Systems  Constitutional: Negative for fever, chills, diaphoresis, activity change, appetite change and fatigue.  HENT: Negative for congestion, ear pain, nosebleeds, rhinorrhea, sinus pressure, sore throat and trouble swallowing.   Eyes: Negative for photophobia and visual disturbance.  Respiratory: Negative for cough, chest tightness, shortness of breath and wheezing.   Cardiovascular: Negative for chest pain, palpitations and leg swelling.  Gastrointestinal: Positive for nausea, vomiting, abdominal pain, diarrhea and abdominal distention. Negative for constipation, blood in stool and rectal pain.  Endocrine: Negative for polydipsia and polyuria.  Genitourinary: Negative for dysuria, urgency, frequency, hematuria, flank pain, difficulty urinating and dyspareunia.  Musculoskeletal: Negative for myalgias, arthralgias, neck pain and neck stiffness.  Skin: Negative for color change, rash and wound.  Neurological: Positive for light-headedness. Negative for dizziness, tremors, seizures, syncope, numbness and headaches.      Allergies  Latex  Home Medications   Prior to Admission medications   Medication Sig Start Date End Date Taking? Authorizing Provider  Probiotic Product (PROBIOTIC DAILY PO) Take 1 capsule by mouth daily.   Yes Historical Provider, MD  doxycycline (VIBRA-TABS) 100 MG tablet Take 1 tablet (100 mg total) by mouth 2 (two) times daily. Patient not taking:  Reported on 05/08/2014 02/25/14   Elvina Sidle, MD   BP 129/67 mmHg  Pulse 72  Temp(Src) 98.8 F (37.1 C) (Oral)  Resp 18  SpO2 100% Physical Exam  Constitutional: She is oriented to person, place, and time. She appears well-developed and well-nourished. No distress.  HENT:  Head: Normocephalic and atraumatic.  Mouth/Throat: Oropharynx is clear and moist. No oropharyngeal exudate.  Eyes: Conjunctivae  are normal. Pupils are equal, round, and reactive to light. Right eye exhibits no discharge. Left eye exhibits no discharge.  Neck: Normal range of motion. Neck supple.  Cardiovascular: Normal rate, regular rhythm, normal heart sounds and intact distal pulses.  Exam reveals no gallop and no friction rub.   No murmur heard. Pulmonary/Chest: Effort normal and breath sounds normal. No stridor. No respiratory distress. She has no wheezes. She has no rales. She exhibits no tenderness.  Abdominal: Soft. Bowel sounds are normal. She exhibits no distension and no mass. There is no hepatosplenomegaly. There is tenderness in the epigastric area and periumbilical area. There is no rebound, no guarding, no CVA tenderness, no tenderness at McBurney's point and negative Murphy's sign.  Musculoskeletal: Normal range of motion. She exhibits no edema or tenderness.  Lymphadenopathy:    She has no cervical adenopathy.  Neurological: She is alert and oriented to person, place, and time.  Skin: Skin is warm and dry. No rash noted. She is not diaphoretic. No erythema. No pallor.  Nursing note and vitals reviewed.   ED Course  Procedures (including critical care time) Labs Review Labs Reviewed  COMPREHENSIVE METABOLIC PANEL - Abnormal; Notable for the following:    Total Bilirubin 0.2 (*)    GFR calc non Af Amer 88 (*)    All other components within normal limits  CBC WITH DIFFERENTIAL - Abnormal; Notable for the following:    WBC 16.7 (*)    Neutrophils Relative % 84 (*)    Neutro Abs 14.0 (*)    Lymphocytes Relative 7 (*)    Monocytes Absolute 1.3 (*)    All other components within normal limits  LIPASE, BLOOD  URINALYSIS, ROUTINE W REFLEX MICROSCOPIC    Imaging Review Ct Abdomen Pelvis W Contrast  05/09/2014   CLINICAL DATA:  Acute onset of lower abdominal pain, nausea, vomiting and diarrhea.  EXAM: CT ABDOMEN AND PELVIS WITH CONTRAST  TECHNIQUE: Multidetector CT imaging of the abdomen and pelvis was  performed using the standard protocol following bolus administration of intravenous contrast.  CONTRAST:  25mL OMNIPAQUE IOHEXOL 300 MG/ML SOLN, OMNIPAQUE IOHEXOL 300 MG/ML SOLN  COMPARISON:  None.  FINDINGS: The visualized lung bases are clear.  The liver and spleen are unremarkable in appearance. The gallbladder is within normal limits. The pancreas and adrenal glands are unremarkable.  The kidneys are unremarkable in appearance. There is no evidence of hydronephrosis. No renal or ureteral stones are seen. No perinephric stranding is appreciated.  No free fluid is identified. The small bowel is unremarkable in appearance. The stomach is within normal limits. No acute vascular abnormalities are seen.  The appendix is borderline prominent, measuring 7-8 mm in maximal diameter, but there is minimal if any associated soft tissue inflammation, and this may reflect the patient's baseline. The colon is unremarkable in appearance. No pericecal lymphadenopathy is seen.  The bladder is mildly distended and grossly remarkable. The uterus is within normal limits. The ovaries are grossly symmetric. No suspicious adnexal masses are seen. No inguinal lymphadenopathy is seen.  No acute osseous abnormalities are  identified.  IMPRESSION: Borderline prominence of the appendix, with minimal if any associated soft tissue inflammation. This may simply reflect the patient's baseline, without definite evidence for appendicitis. Otherwise unremarkable CT of the abdomen and pelvis.   Electronically Signed   By: Roanna RaiderJeffery  Chang M.D.   On: 05/09/2014 00:50     The patient will be asked to follow-up in 24 hours for reexamination of her abdomen.  The patient agrees to the plan and all questions were answered.  Patient is advised this could be an early appendicitis and that is the need for reexamination.  Told to return sooner for any worsening in her condition     Carlyle DollyChristopher W Earnest Mcgillis, PA-C 05/09/14 0152  Lyanne CoKevin M Campos,  MD 05/09/14 [phone]

## 2014-05-09 ENCOUNTER — Emergency Department (HOSPITAL_COMMUNITY)
Admission: EM | Admit: 2014-05-09 | Discharge: 2014-05-09 | Disposition: A | Discharge status: Home / Self Care | Payer: BC Managed Care – PPO | Attending: Emergency Medicine | Admitting: Emergency Medicine

## 2014-05-09 ENCOUNTER — Emergency Department (HOSPITAL_COMMUNITY): Payer: BC Managed Care – PPO

## 2014-05-09 ENCOUNTER — Encounter (HOSPITAL_COMMUNITY): Payer: Self-pay | Admitting: Emergency Medicine

## 2014-05-09 DIAGNOSIS — Z9889 Other specified postprocedural states: Secondary | ICD-10-CM | POA: Diagnosis not present

## 2014-05-09 DIAGNOSIS — Z8619 Personal history of other infectious and parasitic diseases: Secondary | ICD-10-CM | POA: Insufficient documentation

## 2014-05-09 DIAGNOSIS — J45909 Unspecified asthma, uncomplicated: Secondary | ICD-10-CM | POA: Insufficient documentation

## 2014-05-09 DIAGNOSIS — Z3202 Encounter for pregnancy test, result negative: Secondary | ICD-10-CM | POA: Insufficient documentation

## 2014-05-09 DIAGNOSIS — R1031 Right lower quadrant pain: Secondary | ICD-10-CM

## 2014-05-09 DIAGNOSIS — Z792 Long term (current) use of antibiotics: Secondary | ICD-10-CM | POA: Diagnosis not present

## 2014-05-09 DIAGNOSIS — R109 Unspecified abdominal pain: Secondary | ICD-10-CM | POA: Diagnosis present

## 2014-05-09 DIAGNOSIS — Z91040 Latex allergy status: Secondary | ICD-10-CM | POA: Diagnosis not present

## 2014-05-09 DIAGNOSIS — Z862 Personal history of diseases of the blood and blood-forming organs and certain disorders involving the immune mechanism: Secondary | ICD-10-CM | POA: Diagnosis not present

## 2014-05-09 LAB — URINALYSIS, ROUTINE W REFLEX MICROSCOPIC
Bilirubin Urine: NEGATIVE
Bilirubin Urine: NEGATIVE
Glucose, UA: NEGATIVE mg/dL
Glucose, UA: NEGATIVE mg/dL
Hgb urine dipstick: NEGATIVE
Hgb urine dipstick: NEGATIVE
Ketones, ur: NEGATIVE mg/dL
Ketones, ur: NEGATIVE mg/dL
Leukocytes, UA: NEGATIVE
Leukocytes, UA: NEGATIVE
Nitrite: NEGATIVE
Nitrite: NEGATIVE
Protein, ur: NEGATIVE mg/dL
Protein, ur: NEGATIVE mg/dL
Specific Gravity, Urine: 1.03 (ref 1.005–1.030)
Specific Gravity, Urine: 1.04 — ABNORMAL HIGH (ref 1.005–1.030)
Urobilinogen, UA: 0.2 mg/dL (ref 0.0–1.0)
Urobilinogen, UA: 0.2 mg/dL (ref 0.0–1.0)
pH: 5.5 (ref 5.0–8.0)
pH: 6 (ref 5.0–8.0)

## 2014-05-09 LAB — CBC WITH DIFFERENTIAL/PLATELET
Basophils Absolute: 0 10*3/uL (ref 0.0–0.1)
Basophils Relative: 0 % (ref 0–1)
EOS ABS: 0.1 10*3/uL (ref 0.0–0.7)
Eosinophils Relative: 2 % (ref 0–5)
HCT: 35.9 % — ABNORMAL LOW (ref 36.0–46.0)
HEMOGLOBIN: 12.2 g/dL (ref 12.0–15.0)
LYMPHS ABS: 2.3 10*3/uL (ref 0.7–4.0)
LYMPHS PCT: 36 % (ref 12–46)
MCH: 30.9 pg (ref 26.0–34.0)
MCHC: 34 g/dL (ref 30.0–36.0)
MCV: 90.9 fL (ref 78.0–100.0)
MONOS PCT: 5 % (ref 3–12)
Monocytes Absolute: 0.3 10*3/uL (ref 0.1–1.0)
NEUTROS ABS: 3.6 10*3/uL (ref 1.7–7.7)
NEUTROS PCT: 57 % (ref 43–77)
PLATELETS: 219 10*3/uL (ref 150–400)
RBC: 3.95 MIL/uL (ref 3.87–5.11)
RDW: 13.9 % (ref 11.5–15.5)
WBC: 6.4 10*3/uL (ref 4.0–10.5)

## 2014-05-09 LAB — COMPREHENSIVE METABOLIC PANEL
ALK PHOS: 69 U/L (ref 39–117)
ALT: 12 U/L (ref 0–35)
ANION GAP: 12 (ref 5–15)
AST: 15 U/L (ref 0–37)
Albumin: 3.8 g/dL (ref 3.5–5.2)
BILIRUBIN TOTAL: 0.6 mg/dL (ref 0.3–1.2)
BUN: 10 mg/dL (ref 6–23)
CHLORIDE: 102 meq/L (ref 96–112)
CO2: 24 mEq/L (ref 19–32)
Calcium: 9.2 mg/dL (ref 8.4–10.5)
Creatinine, Ser: 0.92 mg/dL (ref 0.50–1.10)
GFR calc non Af Amer: 75 mL/min — ABNORMAL LOW (ref >90)
GFR, EST AFRICAN AMERICAN: 87 mL/min — AB (ref >90)
GLUCOSE: 87 mg/dL (ref 70–99)
POTASSIUM: 3.9 meq/L (ref 3.7–5.3)
SODIUM: 138 meq/L (ref 137–147)
TOTAL PROTEIN: 7.3 g/dL (ref 6.0–8.3)

## 2014-05-09 LAB — PREGNANCY, URINE: Preg Test, Ur: NEGATIVE

## 2014-05-09 LAB — LIPASE, BLOOD: Lipase: 34 U/L (ref 11–59)

## 2014-05-09 MED ORDER — IOHEXOL 300 MG/ML  SOLN: 100.0000 mL | Freq: Once | INTRAMUSCULAR | Status: DC | PRN

## 2014-05-09 NOTE — ED Notes (Signed)
Nurse will draw labs. 

## 2014-05-09 NOTE — Progress Notes (Signed)
EDCM spoke to patient at bedside.  Patient confirms her pcp is Dr. Elizabeth Dewey.  System updated. 

## 2014-05-09 NOTE — ED Provider Notes (Signed)
CSN: [id number]     Arrival date & time 05/09/14  1623 History   First MD Initiated Contact with Patient 05/09/14 1700     Chief Complaint  Patient presents with  . follow up CT     (Consider location/radiation/quality/duration/timing/severity/associated sxs/prior Treatment) HPI Comments: Patient is a 43 year old female with a past medical history of asthma and PCOS who presents for a recheck of her abdominal pain. Patient was seen in the ED yesterday for abdominal pain and her CT showed mild inflammation of the appendix. Patient was advised to return today for a recheck. The pain is located in the RLQ and does not radiate. The pain is described as aching and mild. The pain started gradually and progressively worsened since the onset. No alleviating/aggravating factors. The patient has tried nothing for symptoms without relief. Associated symptoms include bloating. Patient denies fever, headache, NVD, chest pain, SOB, dysuria, constipation, abnormal vaginal bleeding/discharge.      Past Medical History  Diagnosis Date  . Asthma   . Genital warts   . PCOS (polycystic ovarian syndrome)   . Anemia    Past Surgical History  Procedure Laterality Date  . Hysteroscopy      with polyp excision   Family History  Problem Relation Age of Onset  . Prostate cancer Father   . Prostate cancer Maternal Grandfather   . Hypertension Paternal Grandmother   . Cancer Paternal Grandfather   . Diabetes Father   . Hypertension Father   . CVA Paternal Grandmother   . Heart disease Father    History  Substance Use Topics  . Smoking status: Never Smoker   . Smokeless tobacco: Never Used  . Alcohol Use: No   OB History    No data available     Review of Systems  Constitutional: Negative for fever, chills and fatigue.  HENT: Negative for trouble swallowing.   Eyes: Negative for visual disturbance.  Respiratory: Negative for shortness of breath.   Cardiovascular: Negative for chest pain and  palpitations.  Gastrointestinal: Positive for abdominal pain. Negative for nausea, vomiting and diarrhea.  Genitourinary: Negative for dysuria and difficulty urinating.  Musculoskeletal: Negative for arthralgias and neck pain.  Skin: Negative for color change.  Neurological: Negative for dizziness and weakness.  Psychiatric/Behavioral: Negative for dysphoric mood.      Allergies  Latex  Home Medications   Prior to Admission medications   Medication Sig Start Date End Date Taking? Authorizing Provider  Probiotic Product (PROBIOTIC DAILY PO) Take 1 capsule by mouth daily.   Yes Historical Provider, MD  doxycycline (VIBRA-TABS) 100 MG tablet Take 1 tablet (100 mg total) by mouth 2 (two) times daily. Patient not taking: Reported on 05/09/2014 02/25/14   Elvina SidleKurt Lauenstein, MD  HYDROcodone-acetaminophen (NORCO/VICODIN) 5-325 MG per tablet Take 1 tablet by mouth every 4 (four) hours as needed for moderate pain.    Historical Provider, MD  promethazine (PHENERGAN) 25 MG tablet Take 25 mg by mouth every 6 (six) hours as needed for nausea or vomiting.    Historical Provider, MD   BP 117/77 mmHg  Pulse 88  Resp 20  SpO2 100% Physical Exam  Constitutional: She is oriented to person, place, and time. She appears well-developed and well-nourished. No distress.  HENT:  Head: Normocephalic and atraumatic.  Eyes: Conjunctivae and EOM are normal.  Neck: Neck supple.  Cardiovascular: Normal rate and regular rhythm.  Exam reveals no gallop and no friction rub.   No murmur heard. Pulmonary/Chest: Effort normal  and breath sounds normal. She has no wheezes. She has no rales. She exhibits no tenderness.  Abdominal: Soft. She exhibits no distension. There is tenderness. There is no rebound.  Mild RLQ tenderness to palpation. No other focal tenderness.   Musculoskeletal: Normal range of motion.  Neurological: She is alert and oriented to person, place, and time. Coordination normal.  Speech is  goal-oriented. Moves limbs without ataxia.   Skin: Skin is warm and dry.  Psychiatric: She has a normal mood and affect. Her behavior is normal.  Nursing note and vitals reviewed.   ED Course  Procedures (including critical care time) Labs Review Labs Reviewed  CBC WITH DIFFERENTIAL - Abnormal; Notable for the following:    HCT 35.9 (*)    All other components within normal limits  COMPREHENSIVE METABOLIC PANEL - Abnormal; Notable for the following:    GFR calc non Af Amer 75 (*)    GFR calc Af Amer 87 (*)    All other components within normal limits  LIPASE, BLOOD  URINALYSIS, ROUTINE W REFLEX MICROSCOPIC  PREGNANCY, URINE    Imaging Review Ct Abdomen Pelvis W Contrast  05/09/2014   CLINICAL DATA:  Acute onset of lower abdominal pain, nausea, vomiting and diarrhea.  EXAM: CT ABDOMEN AND PELVIS WITH CONTRAST  TECHNIQUE: Multidetector CT imaging of the abdomen and pelvis was performed using the standard protocol following bolus administration of intravenous contrast.  CONTRAST:  25mL OMNIPAQUE IOHEXOL 300 MG/ML SOLN, 100mL OMNIPAQUE IOHEXOL 300 MG/ML SOLN  COMPARISON:  None.  FINDINGS: The visualized lung bases are clear.  The liver and spleen are unremarkable in appearance. The gallbladder is within normal limits. The pancreas and adrenal glands are unremarkable.  The kidneys are unremarkable in appearance. There is no evidence of hydronephrosis. No renal or ureteral stones are seen. No perinephric stranding is appreciated.  No free fluid is identified. The small bowel is unremarkable in appearance. The stomach is within normal limits. No acute vascular abnormalities are seen.  The appendix is borderline prominent, measuring 7-8 mm in maximal diameter, but there is minimal if any associated soft tissue inflammation, and this may reflect the patient's baseline. The colon is unremarkable in appearance. No pericecal lymphadenopathy is seen.  The bladder is mildly distended and grossly  remarkable. The uterus is within normal limits. The ovaries are grossly symmetric. No suspicious adnexal masses are seen. No inguinal lymphadenopathy is seen.  No acute osseous abnormalities are identified.  IMPRESSION: Borderline prominence of the appendix, with minimal if any associated soft tissue inflammation. This may simply reflect the patient's baseline, without definite evidence for appendicitis. Otherwise unremarkable CT of the abdomen and pelvis.   Electronically Signed   By: Roanna RaiderJeffery  Chang M.D.   On: 05/09/2014 00:50     EKG Interpretation None      MDM   Final diagnoses:  RLQ abdominal pain    6:28 PM Labs and urinalysis pending. I spoke with Dr. Carolynne Edouardoth about the patient who recommends repeat CT to examine appendix. Vitals stable and patient afebrile.   8:39 PM CT unremarkable for acute changes. Patient will be discharged with instructions to follow up with PCP if symptoms persist.    Emilia BeckKaitlyn Jessye Imhoff, PA-C 05/09/14 2040  Gwyneth SproutWhitney Plunkett, MD 05/09/14 2336

## 2014-05-09 NOTE — Discharge Instructions (Signed)
Return here in 24 hours for a recheck or sooner for any worsening in your condition

## 2014-05-09 NOTE — ED Notes (Signed)
Pt states that she was seen here yesterday and was told to come back here today for follow up CT scan.  I've called CT and pt is not on their schedule.

## 2014-05-09 NOTE — ED Notes (Signed)
PA-C at bedside 

## 2014-05-09 NOTE — Discharge Instructions (Signed)
Your tests today are negative for appendicitis. Refer to attached documents for more information. Follow up with your doctor if symptoms persist.

## 2014-09-12 ENCOUNTER — Telehealth: Payer: Self-pay | Admitting: Obstetrics & Gynecology

## 2014-09-12 NOTE — Telephone Encounter (Signed)
Spoke with patient. Patient states that she was recently tested for HSV at a doctors office in her area. 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 "I am floored because I have been so careful and I have been tested before and it was negative. I know I have talked to Dr.Miller about it before and she told me you have to be careful because sometimes they can come back as a false positive. Did I have this tested last year?" Advised per lab results from 05/09/2014 HSV was not tested. Patient would like me to look at previous years to see if this was done. Per patient's paper chart HSV testing was done on 10/17/2007 which were negative. Patient has a lot of questions regarding testing and results. Advised will need to schedule an office visit with Dr.Miller to discuss. Patient is agreeable and would like a retest at the appointment. Appointment scheduled with Dr.Miller for 09/25/2014 at 8:30am. Patient is agreeable to date and time. Will bring test results with her to appointment.  Routing to provider for final review. Patient agreeable to disposition. Will close encounter

## 2014-09-12 NOTE — Telephone Encounter (Signed)
Patient wants to talk with the nurse. She wants to know the name of a specific STD test. She states this is kind of an emergency for her.

## 2014-09-25 ENCOUNTER — Encounter: Payer: Self-pay | Admitting: Obstetrics & Gynecology

## 2014-09-25 ENCOUNTER — Ambulatory Visit: Payer: BC Managed Care – PPO | Admitting: Obstetrics & Gynecology

## 2014-09-25 ENCOUNTER — Ambulatory Visit (INDEPENDENT_AMBULATORY_CARE_PROVIDER_SITE_OTHER): Payer: BC Managed Care – PPO | Admitting: Nurse Practitioner

## 2014-09-25 ENCOUNTER — Encounter: Payer: Self-pay | Admitting: Nurse Practitioner

## 2014-09-25 VITALS — BP 122/74 | HR 68 | Ht 67.5 in | Wt 246.0 lb

## 2014-09-25 DIAGNOSIS — R899 Unspecified abnormal finding in specimens from other organs, systems and tissues: Secondary | ICD-10-CM

## 2014-09-25 NOTE — Progress Notes (Signed)
Patient ID: Kim Webb, female   DOB: Sep 29, 1970, 44 y.o.   MRN: [id number] S: This 44 yo MAA Fe comes in for a consult visit.  She had labs done by PCP 09/01/14.  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  She has been very concerned about this with no history of exposure and husband with history of exposure.  She has not had any lesions or suspicions about HSV.  She was tested here 10/2007 for HSV II and was negative.  She has been married for 6 years and together for 10 years.  In the interim she has done some research on false positive results, she would like to be tested again for both IGG and IGM.  She had apt. this am with Dr. Hyacinth MeekerMiller to discuss but missed the apt.   She comes in now and understands that I will consult with Dr. Hyacinth MeekerMiller with test results.  O: She is quite anxious and many questions.  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  Will follow with labs - current labs bought in today will be scanned.  Consult time 18 minutes face to face with counseling.

## 2014-09-25 NOTE — Patient Instructions (Signed)
Will call you with test results.

## 2014-09-25 NOTE — Progress Notes (Signed)
Reviewed personally.  M. Suzanne Rayford Williamsen, MD.  

## 2014-09-26 LAB — HSV(HERPES SMPLX)ABS-I+II(IGG+IGM)-BLD
HSV 2 GLYCOPROTEIN G AB, IGG: 1.4 IV — AB
Herpes Simplex Vrs I&II-IgM Ab (EIA): 0.76 INDEX

## 2014-09-29 ENCOUNTER — Telehealth: Payer: Self-pay | Admitting: Obstetrics & Gynecology

## 2014-09-29 NOTE — Telephone Encounter (Signed)
Lauro FranklinPatricia Rolen-Grubb, FNP please review labs from 4/21.

## 2014-09-29 NOTE — Telephone Encounter (Signed)
Patient is calling for recent results. I called her because she was on the wait list but she has not gotten the results yet. Patient requests results then will schedule at that time.

## 2014-09-29 NOTE — Telephone Encounter (Signed)
Dr. Hyacinth MeekerMiller see labs and provide info for the pt. Thanks.

## 2014-09-30 NOTE — Telephone Encounter (Signed)
Patient returning call. Verified number with patient and it is the correct number [phone].

## 2014-09-30 NOTE — Telephone Encounter (Signed)
Attempted call to pt yesterday several times.  Only rang busy.  Sent message to pt via MyChart.

## 2014-09-30 NOTE — Telephone Encounter (Signed)
I called pt and spoke to her around 5:30.  Almost 30 minutes spent on phone with pt.  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 Advised I would be out for remainder of week.  Pt also stated could return for another visit if needed.

## 2014-09-30 NOTE — Telephone Encounter (Signed)
Please call pt and see if can schedule consult for additional discussion regarding HSV results.  Thanks.

## 2014-10-01 ENCOUNTER — Encounter: Payer: Self-pay | Admitting: Nurse Practitioner

## 2014-10-01 NOTE — Telephone Encounter (Signed)
Spoke with patient. Consult scheduled for 5/16 at 3pm with Dr.Miller. Patient is agreeable to date and time.  Routing to provider for final review. Patient agreeable to disposition. Will close encounter

## 2014-10-20 ENCOUNTER — Ambulatory Visit (INDEPENDENT_AMBULATORY_CARE_PROVIDER_SITE_OTHER): Payer: BC Managed Care – PPO | Admitting: Obstetrics & Gynecology

## 2014-10-20 VITALS — BP 120/80 | HR 64 | Resp 16 | Wt 249.0 lb

## 2014-10-20 DIAGNOSIS — Z658 Other specified problems related to psychosocial circumstances: Secondary | ICD-10-CM

## 2014-10-20 DIAGNOSIS — R768 Other specified abnormal immunological findings in serum: Secondary | ICD-10-CM

## 2014-10-20 DIAGNOSIS — R894 Abnormal immunological findings in specimens from other organs, systems and tissues: Secondary | ICD-10-CM

## 2014-10-20 NOTE — Progress Notes (Signed)
Subjective:     Patient ID: Kim Webb, female   DOB: 14-Jun-1970, 44 y.o.   MRN: [id number]  HPI 44 yo married AAF here for discussion of recent HSV serologic testing.  Pt and spouse are having marital issues and considering separation.  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  She did have testing in our office in 5/09 for HSV 2 IgG which were negative.  Pt has not had any other sexual partners except her husband for the last 10 years.  They have been married for 6 years.  She does have one son.  She came and saw Ria CommentPatricia Grubb, NP on 09/25/14 for repeat testing and this was, again, positive for HSV2 IgG.  HSV 1 antibody testing was negative, of note.    Pt is just personally struggling with this.  Has done lots and lots of research and has questions including:  What is chance of false positive?  (very low with two separate tests in two separate labs)  When did she acquire this?  Could she infect her child with this?  What is chance she will have an outbreak?  How can she tell if she is infectious to others?  Should she be on treatment? Should she take Lysine?  Answered all questions and reviewed current literature and recommendations with pt regarding prophylaxis and treatment regimens.   Pt reports she feels dirty and that no one in the future would ever want her to be in a relationship.  Advised pt I hope she will be able to move past this but right now I know I cannot say anything that will make her feel any different.  However,  I know patients who are in relationships where one partner is positive and the other is not, so this is possible.  I do really feel she needs counseling due to the negative thought processes this is causing for her.    She is also very angry at her husband but have tried to remind her the only this this test really shows is that she has had exposure to someone (most likely her spouse) who is HSV 2 positive and that individual may never  have had an outbreak as well.  Unless she is sure about infidelity, this test does not confirm that infidelity has occurred.    Pt requests physical exam today, just to be sure.  She reports she is thinking about her vulvar so much she feels like she is itching all of the time.  Has no specific vulvar lesions, just all over sensation of irritation.  Has used OTC monistat.  All other STD testing in March was negative.  Review of Systems  All other systems reviewed and are negative.      Objective:   Physical Exam  Constitutional: She is oriented to person, place, and time. She appears well-developed and well-nourished.  Genitourinary: Vagina normal. There is no rash, tenderness, lesion or injury on the right labia. There is no rash, tenderness, lesion or injury on the left labia. No erythema, tenderness or bleeding in the vagina. No foreign body around the vagina. No signs of injury around the vagina. No vaginal discharge found.  Lymphadenopathy:       Right: No inguinal adenopathy present.       Left: No inguinal adenopathy present.  Neurological: She is alert and oriented to person, place, and time.  Psychiatric: She has a normal mood and affect.  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  Pt will schedule [facility]EX for next year as has already been done this year with PCP.  ~Lengthy visit with pt.  >45 minutes spent with patient with almost all of the time spent in face to face discussion of above.

## 2014-10-21 ENCOUNTER — Telehealth: Payer: Self-pay | Admitting: Obstetrics & Gynecology

## 2014-10-21 ENCOUNTER — Encounter: Payer: Self-pay | Admitting: Obstetrics & Gynecology

## 2014-10-29 ENCOUNTER — Encounter: Payer: Self-pay | Admitting: Obstetrics & Gynecology

## 2014-10-29 DIAGNOSIS — R7689 Other specified abnormal immunological findings in serum: Secondary | ICD-10-CM | POA: Insufficient documentation

## 2014-10-29 DIAGNOSIS — R768 Other specified abnormal immunological findings in serum: Secondary | ICD-10-CM | POA: Insufficient documentation

## 2015-02-03 ENCOUNTER — Telehealth: Payer: Self-pay | Admitting: Obstetrics & Gynecology

## 2015-02-03 NOTE — Telephone Encounter (Signed)
Patient called and said, "I have a rash on and around my cheek and my mouth I think may be an allergic reaction. I'd like to come see Dr. Hyacinth Meeker this afternoon, if possible."

## 2015-02-03 NOTE — Telephone Encounter (Signed)
Spoke with patient. Patient has a history of HSV 2. Patient states that she has a bump that has come up above her lip and she is worried it is herpes. Bump is the size of the tip of a pen head and is flesh colored. Denies any appearence of a blister, tingling, or pain to the area. Denies that the area has grown in size at all since it appeared. Denies any appearance of fluid under the tissue or redness. Advised this is likely a skin blemish, but I would be happy to schedule her to have it evaluated in our office if she would like. Patient is requesting an appointment for tomorrow. Appointment scheduled for tomorrow 8/31 at 3 pm with Lauro Franklin, FNP. Patient is agreeable to date and time.  Routing to provider for final review. Patient agreeable to disposition. Will close encounter.

## 2015-02-04 ENCOUNTER — Ambulatory Visit: Payer: BC Managed Care – PPO | Admitting: Nurse Practitioner

## 2015-02-04 ENCOUNTER — Telehealth: Payer: Self-pay | Admitting: Nurse Practitioner

## 2015-02-04 NOTE — Telephone Encounter (Signed)
Patient canceled her appointment at 3:00pm today for "concerned of HSV outbreak/kh". Patient did not reschedule at this time.

## 2015-02-04 NOTE — Telephone Encounter (Signed)
Ok to close [employer]

## 2015-03-20 ENCOUNTER — Ambulatory Visit (INDEPENDENT_AMBULATORY_CARE_PROVIDER_SITE_OTHER): Payer: BC Managed Care – PPO | Admitting: Family Medicine

## 2015-03-20 VITALS — BP 140/90 | HR 69 | Temp 98.3°F | Resp 16 | Ht 67.25 in | Wt 250.0 lb

## 2015-03-20 DIAGNOSIS — T7840XA Allergy, unspecified, initial encounter: Secondary | ICD-10-CM | POA: Diagnosis not present

## 2015-03-20 NOTE — Progress Notes (Signed)
° °  Subjective:    Patient ID: Kim Webb, female    DOB: 12/31/1970, 44 y.o.   MRN: [id number] This chart was scribed for Elvina SidleKurt Lauenstein, MD by Littie Deedsichard Sun, Medical Scribe. This patient was seen in Room 2 and the patient's care was started at 1:33 PM.   HPI HPI Comments: Kim Webb is a 44 y.o. female who presents to the [facility] complaining of intermittent itching to the inside of her mouth that started 2 days ago. Patient notes that it first started a few hours after eating chocolate (Little Debbie). She also notes that she did take chaga mushrooms as a supplement a few months ago and had noticed some itching then. She has also noticed itching around the nose and tingling around the mouth. She did try Zyrtec which did provide some relief. Patient notes that she is HSV-2 seropositive and is concerned about herpes.  Patient works at [employer].  Review of Systems  HENT: Positive for mouth sores.        Objective:   Physical Exam CONSTITUTIONAL: Well developed/well nourished HEAD: Normocephalic/atraumatic EYES: EOM/PERRL ENMT: Mucous membranes moist, slight erythema behind the midline upper incisors and one small papule on the left side of the torus palatine. No vesicles and no erosions. NECK: supple no meningeal signs SPINE: entire spine nontender CV: S1/S2 noted, no murmurs/rubs/gallops noted Repeat blood pressure: 138/80. Pulse: 69. LUNGS: Lungs are clear to auscultation bilaterally, no apparent distress ABDOMEN: soft, nontender, no rebound or guarding GU: no cva tenderness NEURO: Pt is awake/alert, moves all extremitiesx4 EXTREMITIES: pulses normal, full ROM SKIN: warm, color normal PSYCH: no abnormalities of mood noted        Assessment & Plan:   By signing my name below, I, Littie Deedsichard Sun, attest that this documentation has been prepared under the direction and in the presence of Elvina SidleKurt Lauenstein, MD.  Electronically Signed: Littie Deedsichard Sun,  Medical Scribe. 03/20/2015. 1:33 PM. This chart was scribed in my presence and reviewed by me personally.    ICD-9-CM ICD-10-CM   1. Allergic reaction, initial encounter 995.3 T78.40XA      Signed, Elvina SidleKurt Lauenstein, MD

## 2015-03-20 NOTE — Patient Instructions (Signed)
The irritation you have behind her front teeth and on her palate is consistent with an allergic reaction. This is minor and will go away in a couple days. If you want symptomatic relief you can take Zyrtec.  The low titer on past HSV test is impossible to interpret. You do not have any history consistent with HSV disease and certainly the findings today do not suggest any any HSV disease.

## 2015-03-23 ENCOUNTER — Encounter: Payer: Self-pay | Admitting: Obstetrics and Gynecology

## 2015-03-23 ENCOUNTER — Ambulatory Visit (INDEPENDENT_AMBULATORY_CARE_PROVIDER_SITE_OTHER): Payer: BC Managed Care – PPO | Admitting: Obstetrics and Gynecology

## 2015-03-23 ENCOUNTER — Telehealth: Payer: Self-pay | Admitting: Obstetrics & Gynecology

## 2015-03-23 VITALS — BP 118/82 | HR 64 | Resp 16 | Wt 251.0 lb

## 2015-03-23 DIAGNOSIS — B009 Herpesviral infection, unspecified: Secondary | ICD-10-CM | POA: Diagnosis not present

## 2015-03-23 DIAGNOSIS — L298 Other pruritus: Secondary | ICD-10-CM | POA: Diagnosis not present

## 2015-03-23 DIAGNOSIS — F418 Other specified anxiety disorders: Secondary | ICD-10-CM

## 2015-03-23 NOTE — Telephone Encounter (Signed)
Patient walked into the office today. Spoke with patient in my office regarding symptoms. States she has been experiencing oral itching and irritation for around one week. Patient is concerned due to a positive HSV 2 titer she had earlier this year. Was seen with urgent care on 03/20/2015 and advised "I had a food allergy and to take Zyrtec." States that she had relief with Zyrtec but now itching has returned. "I just know something is not right and I am so concerned about HSV. I am not sure if it is related or not but I am very worried." Patient would like to be seen today for further evaluation. Appointment scheduled for today 03/23/2015 at 12:45pm with Dr.Jertson. Agreeable to date and time.  Routing to provider for final review. Patient agreeable to disposition. Will close encounter.

## 2015-03-23 NOTE — Telephone Encounter (Signed)
Patient calling she said she is having some oral itching would like to be seen today if possible. Best contact [phone]

## 2015-03-23 NOTE — Progress Notes (Signed)
Patient ID: Kim Webb, female   DOB: [birth year], 44 y.o.   MRN: [id number] GYNECOLOGY  VISIT   HPI: 44 y.o.   Married  [place]  female   [id number] with Patient's last menstrual period was 03/02/2015.   here c/o oral itching x 4 days. The itching is inside her mouth. She went to urgent care a few days and was told she likely had an allergy and she should take Zyrtec. The Zyrtec isn't helping. Her real worry is about HSV. She was diagnosed with +HSV IgG serology back in 4/16 (negative IgM). She is in the process of a divorcee, has mild depression and anxiety. She was given a script for Zoloft a couple of months ago, never started it. She is now dating a new man and is very worried about HSV. They haven't been sexually active.     GYNECOLOGIC HISTORY: Patient's last menstrual period was 03/02/2015. Contraception:condom Menopausal hormone therapy: N/A        OB History    Gravida Para Term Preterm AB TAB SAB Ectopic Multiple Living   0 0 0 0 0 0 1         Patient Active Problem List   Diagnosis Date Noted  . 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 HEMOCCULT POSITIVE STOOL 09/24/2008  . ANXIETY 09/22/2008  . DEPRESSION 09/22/2008  . BACK PAIN, LUMBAR 09/22/2008    Past Medical History  Diagnosis Date  . Asthma   . Genital warts   . PCOS (polycystic ovarian syndrome)   . Anemia     Past Surgical History  Procedure Laterality Date  . Hysteroscopy      with polyp excision    Current Outpatient Prescriptions  Medication Sig Dispense Refill  . cetirizine (ZYRTEC) 10 MG tablet Take 10 mg by mouth daily.     No current facility-administered medications for this visit.     ALLERGIES: Latex  Family History  Problem Relation Age of Onset  . Prostate cancer Father   . Prostate cancer Maternal Grandfather   . Hypertension Paternal Grandmother   . Cancer Paternal Grandfather   . Diabetes Father   . Hypertension Father   . CVA Paternal Grandmother   . Heart  disease Father     Social History   Social History  . Marital Status: Married    Spouse Name: N/A  . Number of Children: N/A  . Years of Education: N/A   Occupational History  . Not on file.   Social History Main Topics  . Smoking status: Never Smoker   . Smokeless tobacco: Never Used  . Alcohol Use: No  . Drug Use: No  . Sexual Activity: Not on file   Other Topics Concern  . Not on file   Social History Narrative    Review of Systems  HENT:       Oral itching and irritation   All other systems reviewed and are negative.   PHYSICAL EXAMINATION:    BP 118/82 mmHg  Pulse 64  Resp 16  Wt 251 lb (113.853 kg)  LMP 03/02/2015    General appearance: alert, cooperative and appears stated age Mouth: normal appearing tongue and palate, no erythema, no blisters. Tonsill's are not inflamed or erythematous.    ASSESSMENT Oral pruritus, etiology unclear, no signs of HSV +HSV serology, never had an outbreak Depression/Anxiety    PLAN Culture for oral yeast F/U with her primary for further evaluation of her oral pruritus Long discussion about HSV, the  prevalence, risk of transmission Recommend she discuss HSV with a future partner, could consider serology testing on him Discussed that condoms can help decrease transmission Discussed the option of suppressive therapy, reviewed study from [facility]: over 8 month period, suppression with Valtrex decreased the transmission from 3.6-1.9% Discussed seeing a therapist and/or medication for treatment of her anxiety and depression    An After Visit Summary was printed and given to the patient.  25 minutes face to face time of which over 50% was spent in counseling.

## 2015-03-26 ENCOUNTER — Encounter: Payer: Self-pay | Admitting: Obstetrics & Gynecology

## 2015-03-26 ENCOUNTER — Telehealth: Payer: Self-pay | Admitting: Emergency Medicine

## 2015-03-26 NOTE — Telephone Encounter (Signed)
Telephone call for triage created to discuss message with patient and disposition as appropriate.   

## 2015-03-26 NOTE — Telephone Encounter (Signed)
Chief Complaint  Patient presents with  . Advice Only    Patient sent mychart message     ===View-only below this line===   ----- Message -----    FromDeloria Lair: Webb,Kim    Sent: 03/26/2015 11:56 AM EDT      To: Annamaria BootsMILLER, MARY SUZANNE, MD Subject: Non-Urgent Medical Question  Hi Dr. Hyacinth MeekerMiller! Hope all is well. I'm hanging in there.  Something interesting happened lastnight. Out of no where, my upper lip on the right side swelled into a skin colored bump about the size of a large pencil eraser. This happened afyer drinking green tea and chicken nuggets from [employer]. There was no itching, pain, or tingling prior to nor during. I applied a warm rag to the bump and took claritin. Within 9 hours it disappeared. Even now as I write this email it has not come back. No sore, nothing. Was that a cold sore? Since my hsv 2 results, I am watchful of ANYTHING unusual.

## 2015-03-26 NOTE — Telephone Encounter (Signed)
Call to patient. She confirms message as below. Denies complaints at this time.  She was seen in urgent care on 03/20/15 and here in office on 03/23/15 for evaluation of itching in mouth.  Discussed with patient that her symptoms did not sound like a HSV outbreak on her lip. Discussed symptoms and appearance of oral blisters that dry out and become encrusted.  Discussed with patient that her symptoms seem to be related to foods and encouraged her to see her Primary care physician to discuss referral to allergist or specialist. Patient states she was taking a supplement called Iaso Tea and has been having oral symptoms since she discontinued that one month ago. Encouraged patient not to restart. Advised patient to continue with Claritin or Zyrtec as directed by urgent care and call 911 if develops and sob or breathing difficulties.  Patient verbalized understanding.  Advised Dr. Hyacinth MeekerMiller will review message and I will call back if any further advise or recommendations from Dr. Hyacinth MeekerMiller. Patient agreeable.

## 2015-03-27 NOTE — Telephone Encounter (Signed)
Agree that her symptoms are not consistent with HSV of lip.  Ok to close encounter.

## 2015-03-31 ENCOUNTER — Telehealth: Payer: Self-pay

## 2015-03-31 LAB — FUNGUS CULTURE W SMEAR: Smear Result: NONE SEEN

## 2015-03-31 MED ORDER — FLUCONAZOLE 100 MG PO TABS: 100.0000 mg | ORAL_TABLET | Freq: Every day | ORAL | Status: DC

## 2015-03-31 NOTE — Telephone Encounter (Signed)
Dr.Jertson, ID returned. Routing to you for review prior to calling patient incase anything additional or different is needed.

## 2015-03-31 NOTE — Telephone Encounter (Signed)
Spoke with patient. Advised of results as seen below from Dr.Jertson. Patient is agreeable and verbalizes understanding. Rx for Fluconazole 100 mg daily for 7 days #7 0RF [facility] [place] per patient's request. Patient states she read online this could an early sign of HIV and is concerned. All questions answered regarding oral yeast. Offered to schedule patient and appointment for HIV testing but patient declines at this time. Would like to think about this and return call if she would like to have this performed. Aware this can also be performed with her PCP.  Routing to provider for final review. Patient agreeable to disposition. Will close encounter.

## 2015-03-31 NOTE — Telephone Encounter (Signed)
   8d ago    Smear Result No Yeast or Fungal Elements Seen   Organism ID, Bacteria CANDIDA ALBICANS   Resulting Agency [facility]    Narrative     Performed at: [facility]        [address], [address]        [place], [place] [address]       Specimen Collected: 03/23/15 3:57 PM Last Resulted: 03/31/15 9:48 AM

## 2015-03-31 NOTE — Telephone Encounter (Signed)
Left message to call Kaitlyn at [phone]. 

## 2015-03-31 NOTE — Telephone Encounter (Signed)
-----   Message from Romualdo BolkJill Evelyn Jertson, MD sent at 03/30/2015 12:42 PM EDT ----- Please inform the patient that the yeast culture is now growing yeast, ID to follow. If she is still symptomatic, treat with fluconazole 100 mg po qd x 7 days. She should f/u with her primary MD.

## 2015-03-31 NOTE — Telephone Encounter (Signed)
Please go ahead and treat her with the fluconazole if she is still symptomatic. Thanks!!

## 2015-04-06 ENCOUNTER — Telehealth: Payer: Self-pay | Admitting: Obstetrics and Gynecology

## 2015-04-06 MED ORDER — FLUCONAZOLE 100 MG PO TABS: 100.0000 mg | ORAL_TABLET | Freq: Every day | ORAL | Status: DC

## 2015-04-06 NOTE — Telephone Encounter (Signed)
Message left to return call to Julen Rubert at [phone].    

## 2015-04-06 NOTE — Telephone Encounter (Signed)
Spoke with patient. She would like to schedule consult with Dr. Hyacinth MeekerMiller to discuss potentially starting HSV suppression treatment.  She is scheduled for consult with Dr. Hyacinth MeekerMiller per her request.  Also, patient reports that she lost one of her Diflucan tablets and has searched all over her house for it and cannot find it. 1 tablet Diflucan 100 mg po x 1 to complete 7 day treatment. Patient also reports that she recently completed an HIV test and it was negative. Patient will return call with any concerns prior to appointment with Dr. Hyacinth MeekerMiller.  Routing to provider for final review. Patient agreeable to disposition. Will close encounter.

## 2015-04-06 NOTE — Telephone Encounter (Signed)
Patient would like an appointment to discuss her previous diagnosis. Would also like a prescription for one fluconazole tablet called in because she has misplaced.

## 2015-04-08 ENCOUNTER — Ambulatory Visit (INDEPENDENT_AMBULATORY_CARE_PROVIDER_SITE_OTHER): Payer: BC Managed Care – PPO | Admitting: Family Medicine

## 2015-04-08 VITALS — BP 128/80 | HR 80 | Temp 98.3°F | Resp 20 | Ht 67.0 in | Wt 248.4 lb

## 2015-04-08 DIAGNOSIS — K1370 Unspecified lesions of oral mucosa: Secondary | ICD-10-CM | POA: Diagnosis not present

## 2015-04-08 MED ORDER — MAGIC MOUTHWASH W/LIDOCAINE: 5.0000 mL | Freq: Three times a day (TID) | ORAL | Status: DC

## 2015-04-08 NOTE — Progress Notes (Signed)
Kim Webb is a 44 y.o. female who presents today for white in the back of her mouth.  Recent Thursh - Pt was recently tx for presumed oral thursh by her gynecologist.  Tx with 7 days BID fluconazole with possible improvement.  She has one white spot in the posterior molar region which is itchy.  Otherwise no complaints.  No fevers, no chills, weight loss, LAD, dysphagia, dry mouth.   Past Medical History  Diagnosis Date  . Asthma   . Genital warts   . PCOS (polycystic ovarian syndrome)   . Anemia     History  Smoking status  . Never Smoker   Smokeless tobacco  . Never Used    Family History  Problem Relation Age of Onset  . Prostate cancer Father   . Prostate cancer Maternal Grandfather   . Hypertension Paternal Grandmother   . Cancer Paternal Grandfather   . Diabetes Father   . Hypertension Father   . CVA Paternal Grandmother   . Heart disease Father     Current Outpatient Prescriptions on File Prior to Visit  Medication Sig Dispense Refill  . fluconazole (DIFLUCAN) 100 MG tablet Take 1 tablet (100 mg total) by mouth daily. 1 tablet 0  . cetirizine (ZYRTEC) 10 MG tablet Take 10 mg by mouth daily.     No current facility-administered medications on file prior to visit.    ROS: Per HPI.  All other systems reviewed and are negative.   Physical Exam Filed Vitals:   04/08/15 2005  BP: 128/80  Pulse: 80  Temp: 98.3 F (36.8 C)  Resp: 20    Physical Examination: General appearance - alert, well appearing, and in no distress Mouth - MMM, no thrush appreciated.  Small whitish papule on posterior gum/molar region Neck - supple, no significant adenopathy Heart - normal rate and regular rhythm    Chemistry      Component Value Date/Time   NA 138 05/09/2014 1840   K 3.9 05/09/2014 1840   CL 102 05/09/2014 1840   CO2 24 05/09/2014 1840   BUN 10 05/09/2014 1840   CREATININE 0.92 05/09/2014 1840      Component Value Date/Time   CALCIUM 9.2 05/09/2014 1840    ALKPHOS 69 05/09/2014 1840   AST 15 05/09/2014 1840   ALT 12 05/09/2014 1840   BILITOT 0.6 05/09/2014 1840      Lab Results  Component Value Date   WBC 6.4 05/09/2014   HGB 12.2 05/09/2014   HCT 35.9* 05/09/2014   MCV 90.9 05/09/2014   PLT 219 05/09/2014      Assessment/Plan 1) oral papule - Appears to be 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  Tx with magic mouthwash for anything else ongoing.  If continues to be bothersome, could consider dental evaluation or oral surgeon evaluation with bx.

## 2015-04-10 ENCOUNTER — Ambulatory Visit (INDEPENDENT_AMBULATORY_CARE_PROVIDER_SITE_OTHER): Payer: BC Managed Care – PPO | Admitting: Obstetrics & Gynecology

## 2015-04-10 VITALS — BP 110/80 | HR 60 | Resp 16 | Wt 245.0 lb

## 2015-04-10 DIAGNOSIS — Z658 Other specified problems related to psychosocial circumstances: Secondary | ICD-10-CM | POA: Diagnosis not present

## 2015-04-10 DIAGNOSIS — R894 Abnormal immunological findings in specimens from other organs, systems and tissues: Secondary | ICD-10-CM | POA: Diagnosis not present

## 2015-04-10 DIAGNOSIS — R768 Other specified abnormal immunological findings in serum: Secondary | ICD-10-CM

## 2015-04-10 MED ORDER — VALACYCLOVIR HCL 500 MG PO TABS: 500.0000 mg | ORAL_TABLET | Freq: Every day | ORAL | Status: DC

## 2015-04-16 ENCOUNTER — Encounter: Payer: Self-pay | Admitting: Obstetrics & Gynecology

## 2015-04-16 NOTE — Progress Notes (Signed)
Patient ID: Kim Webb, female   DOB: 1970/06/14, 44 y.o.   MRN: [id number]  44 yo DAAF here for discussion of +HSV serologic testing that was done after her husband was unfaithful.  Pt had negative HSV serology before she was married.  Pt has not had any other sexual partners except for her ex husband.  Pt continues to struggle with this positive test done on 09/01/14 showing +HSV2 IgG antibodies.  Pt has never had an outbreak.    She has been to her PCP and to our office several times this year for facial lesions that she questions as possibly facial HSV.  She's never actually had a fever blister on her face to the best of her knowledge.  She was diagnosed with oral thrush which was treated and resolved.  Initial symptoms were burning around her lips and in her mouth.  Again, this has all resolved.    Pt has met someone and he is coming to visit this weekend.  Does not plan to be sexually active at this point.  He is a Company secretary in [place].  He will not be meeting her son, Arthor Captain., this weekend.  They are just planning to spend the weekend together (in separate bedrooms, she adds) just to get to know each other better.  Pt wants some guidance about how to tackle this topic.  She wants to be open and honest with him and feels like she needs to "get this out of the way" early.  We discussed this and I advised what I would personally say if I were having this discussion with someone else.  Pt appreciate of my honestly.  Pt wants to discuss suppressive therapy.  At this time, she feels the relationship is moving forward and she just wants to be prepared.  Pt does not want to get RX filled locally and wants written rx.  Dosage, side effects, risks discussed specifically as per up to date side effects/risks state.  All questions answered.  Rx for 557m daily given with RFs.  Pt also wants to discuss possible pregnancy risks.  She knows she may have trouble getting pregnant, anyway, due to age.  With positive HSV  serology, treatment in the last month is typical.  Risks for baby with vaginal delivery and primary or secondary outbreaks discussed specifically in regards to HSV encephalopathy.  As pt has never had an outbreak this is highly unlikely.  Also, she had a primary cesarean section and would plan a repeat.  This also significantly diminishes risks.  This is all very reassuring to her.  This is very premature, she is aware, but just wanted to talk about it today.  Finally, I asked whether she is seeing a therapist.  I really think it would be a positive thing for her.  She has only discussed this with medical providers and so is not really "talking" about this and her emotional response to the HSV2 positive serology test.  I want her to not "define" herself by this blood test which is what she is doing.  She feels dirty and she does cry a little with me today.  Although, her outlook is better due to the upcoming visit with this new person in her life.  I hope she will not emotionally withdraw if this relationship does not work out.  However, she is dwelling on this more than is typical (and other patients do struggle with this diagnosis but typically figure out how to be "okay" with it  and manage it).  She has not progressed emotionally with this diagnosis as much as I would have hoped.  She is absolutely in agreement with me and would like to establish care here with a psychologist or psychiatrist.  Pt has now gotten a full time job with the [employer] and will be moving back here full time.  States she would like to get her life here established again (has been driving/splitting time between [place] and [place]) and then will call for this information.   Pt very grateful for the time spent in discussion which was 45 minutes.  Assessment:  HSV 2 serology Psychological stressors  Plan:  Starting valtrex for suppression 536m daily.  rx given.  Pt will not get this filled locally. AEX is  scheduled for 2017 already.  Pt reports having normal Pap done 2016.

## 2015-05-13 ENCOUNTER — Telehealth: Payer: Self-pay | Admitting: Obstetrics & Gynecology

## 2015-05-13 NOTE — Telephone Encounter (Signed)
Left message to call Kaitlyn at [phone]. 

## 2015-05-13 NOTE — Telephone Encounter (Signed)
Spoke with patient. Patient states she was seen at another physicians office today and they were able to see her prescription for Valtrex on her list of medications. "It caught me off guard and I am very embarrassed about it. I just was not sure why it was on there." Advised patient that on 04/10/2015 OV with Dr.Miller when this prescription was printed and provided to her to take to the pharmacy it is required that documentation be placed in the chart that this was given. Advised if the physician's office she was seen with is a part of [facility] or shares care every where with [facility] they can see medication lists and OV notes. Patient is agreeable. "I just wanted to know so that I am prepared in the future."  Routing to provider for final review. Patient agreeable to disposition. Will close encounter.

## 2015-05-13 NOTE — Telephone Encounter (Signed)
Patient has a question "about her medical history"  for Dr.Miller's nurse. Last seen 04/10/15.

## 2015-05-24 ENCOUNTER — Ambulatory Visit (INDEPENDENT_AMBULATORY_CARE_PROVIDER_SITE_OTHER): Payer: BC Managed Care – PPO | Admitting: Family Medicine

## 2015-05-24 VITALS — BP 122/86 | HR 96 | Temp 98.6°F | Resp 20 | Wt 252.4 lb

## 2015-05-24 DIAGNOSIS — R05 Cough: Secondary | ICD-10-CM | POA: Diagnosis not present

## 2015-05-24 DIAGNOSIS — J329 Chronic sinusitis, unspecified: Secondary | ICD-10-CM

## 2015-05-24 DIAGNOSIS — J209 Acute bronchitis, unspecified: Secondary | ICD-10-CM | POA: Diagnosis not present

## 2015-05-24 DIAGNOSIS — R059 Cough, unspecified: Secondary | ICD-10-CM

## 2015-05-24 DIAGNOSIS — J310 Chronic rhinitis: Secondary | ICD-10-CM

## 2015-05-24 MED ORDER — AMOXICILLIN 875 MG PO TABS: 875.0000 mg | ORAL_TABLET | Freq: Two times a day (BID) | ORAL | Status: DC

## 2015-05-24 MED ORDER — HYDROCODONE-HOMATROPINE 5-1.5 MG/5ML PO SYRP: 5.0000 mL | ORAL_SOLUTION | ORAL | Status: DC | PRN

## 2015-05-24 MED ORDER — FLUTICASONE PROPIONATE 50 MCG/ACT NA SUSP: 2.0000 | Freq: Every day | NASAL | Status: DC

## 2015-05-24 MED ORDER — BENZONATATE 100 MG PO CAPS: 100.0000 mg | ORAL_CAPSULE | Freq: Three times a day (TID) | ORAL | Status: DC | PRN

## 2015-05-24 NOTE — Patient Instructions (Addendum)
Drink plenty of fluids and get enough rest  Take the amoxicillin 500 mg one twice daily for infection.  Take the benzonatate 1 or 2 pills 3 times daily if needed for daytime cough . Should not cause drowsiness.  Take the Hycodan cough syrup 1 teaspoon every 4-6 hours as needed for nighttime cough. May cause some drowsiness.  Use fluticasone nose spray 2 sprays each nostril twice daily for about 4 days, then reduce to once daily use.  It is fine to use the Ventolin inhaler if needed for wheezing.  Return if worse

## 2015-05-24 NOTE — Progress Notes (Signed)
Patient ID: Kim Webb, female    DOB: [birth year]  Age: 44 y.o. MRN: [id number]  Chief Complaint  Patient presents with  . Cough    started last week  . Sore Throat  . Headache  . Sinusitis    Subjective:   44 year old lady who has been having a cough for the last couple weeks. They moved back into her her home here, and the previous respiratory is were heavy smokers. They have had more respiratory problems since then. She has had a cough for the last 2 weeks. She is coughing up some purulent phlegm. She does not smoke. She lives there with her 44-year-old child. He is ill with a cold and pharyngitis. She has developed upper respiratory sinus type infection with also blowing some yellow mucus out. That is just in the last couple days. She is generally a pretty healthy person. She has had Advair and does have a Ventolin inhaler for when necessary use.  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  Current allergies, medications, problem list, past/family and social histories reviewed.  Objective:  BP 122/86 mmHg  Pulse 96  Temp(Src) 98.6 F (37 C) (Oral)  Resp 20  Wt 252 lb 6.4 oz (114.488 kg)  SpO2 99%  LMP 05/07/2015  Pleasant lady, alert and oriented. TMs are normal. Throat clear with only minimal erythema no exudate. Her sinuses are not significantly tender. Neck supple without significant nodes. Chest clear to auscultation except for minimal rhonchi at the left side. These cleared with deep breathing. Not wheezing at this time. Heart regular without murmurs.  Assessment & Plan:   Assessment: 1. Acute bronchitis, unspecified organism   2. Rhinosinusitis   3. Cough       Plan: Bronchitis with probably a superimposed viral URI. I think she has enough on toward treatment with the purulent cough lasting 2 weeks. Return if not improving.      Patient Instructions  Drink plenty of fluids and get enough rest  Take the amoxicillin 500 mg one twice daily for  infection.  Take the benzonatate 1 or 2 pills 3 times daily if needed for daytime cough . Should not cause drowsiness.  Take the Hycodan cough syrup 1 teaspoon every 4-6 hours as needed for nighttime cough. May cause some drowsiness.  Use fluticasone nose spray 2 sprays each nostril twice daily for about 4 days, then reduce to once daily use.  It is fine to use the Ventolin inhaler if needed for wheezing.  Return if worse     Return if symptoms worsen or fail to improve.   Mearl Olver, MD 05/24/2015

## 2015-06-09 ENCOUNTER — Ambulatory Visit (INDEPENDENT_AMBULATORY_CARE_PROVIDER_SITE_OTHER): Payer: BC Managed Care – PPO | Admitting: Family Medicine

## 2015-06-09 VITALS — BP 117/77 | HR 65 | Temp 99.0°F | Resp 17 | Ht 68.5 in | Wt 257.0 lb

## 2015-06-09 DIAGNOSIS — R05 Cough: Secondary | ICD-10-CM

## 2015-06-09 DIAGNOSIS — Z111 Encounter for screening for respiratory tuberculosis: Secondary | ICD-10-CM

## 2015-06-09 DIAGNOSIS — J069 Acute upper respiratory infection, unspecified: Secondary | ICD-10-CM

## 2015-06-09 DIAGNOSIS — R059 Cough, unspecified: Secondary | ICD-10-CM

## 2015-06-09 NOTE — Progress Notes (Addendum)
Subjective:    Patient ID: Kim Webb, female    DOB: 15-Aug-1970, 45 y.o.   MRN: [id number] By signing my name below, I, Kim Webb, attest that this documentation has been prepared under the direction and in the presence of Kim Webb. Electronically Signed: Javier Dockerobert Ryan Webb, ER Scribe. 06/09/2015. 8:10 PM.  Chief Complaint  Patient presents with  . URI  . Immunizations    HPI HPI Comments: Kim Webb is a 45 y.o. female with a past hx of asthma who [facility] for a TB skin test. She is also complaining of improving cough and and fatigue since December 18th. She states her sx are largely resolved. [facility], and diagnosed with bronchitis and sinusitis. She was treated with ammoxicillin 875mg  twice per day as well as tessalon, and hycodan.   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  Patient Active Problem List   Diagnosis Date Noted  . 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 HEMOCCULT POSITIVE STOOL 09/24/2008  . ANXIETY 09/22/2008  . DEPRESSION 09/22/2008  . BACK PAIN, LUMBAR 09/22/2008   Past Medical History  Diagnosis Date  . Asthma   . Genital warts   . PCOS (polycystic ovarian syndrome)   . Anemia    Past Surgical History  Procedure Laterality Date  . Hysteroscopy      with polyp excision   No Active Allergies Prior to Admission medications   Medication Sig Start Date End Date Taking? Authorizing Provider  cetirizine (ZYRTEC) 10 MG tablet Take 10 mg by mouth daily.   Yes Historical Provider, Webb  fluticasone (FLONASE) 50 MCG/ACT nasal spray Place 2 sprays into both nostrils daily. 05/24/15  Yes Kim Najjaravid H Hopper, Webb  HYDROcodone-homatropine [facility](HYCODAN) 5-1.5 MG/5ML syrup Take 5 mLs by mouth every 4 (four) hours as needed. 05/24/15  Yes Kim Najjaravid H Hopper, Webb  benzonatate (TESSALON) 100 MG capsule Take 1-2 capsules (100-200 mg total) by mouth 3 (three) times daily as needed. Patient not taking:  Reported on 06/09/2015 05/24/15   Kim Najjaravid H Hopper, Webb  valACYclovir (VALTREX) 500 MG tablet Take 1 tablet (500 mg total) by mouth daily. 1 tablet po QD. Patient not taking: Reported on 06/09/2015 04/10/15   Kim BearsMary S Miller, Webb   Social History   Social History  . Marital Status: Married    Spouse Name: N/A  . Number of Children: N/A  . Years of Education: N/A   Occupational History  . Not on file.   Social History Main Topics  . Smoking status: Never Smoker   . Smokeless tobacco: Never Used  . Alcohol Use: No  . Drug Use: No  . Sexual Activity: Not on file   Other Topics Concern  . Not on file   Social History Narrative   Review of Systems  Constitutional: Positive for fatigue. Negative for fever and chills.  HENT: Negative for congestion and rhinorrhea.   Respiratory: Positive for cough (Mild). Negative for wheezing.       Objective:  BP 117/77 mmHg  Pulse 65  Temp(Src) 99 F (37.2 C) (Oral)  Resp 17  Ht 5' 8.5" (1.74 m)  Wt 257 lb (116.574 kg)  BMI 38.50 kg/m2  SpO2 97%  LMP 05/07/2015  Physical Exam  Constitutional: She is oriented to person, place, and time. She appears well-developed and well-nourished. No distress.  HENT:  Head: Normocephalic and atraumatic.  Right Ear: Hearing, tympanic membrane, external ear and ear canal normal.  Left Ear: Hearing, tympanic membrane, external ear and ear canal normal.  Nose: Nose normal.  Mouth/Throat: Oropharynx is clear and moist. No oropharyngeal exudate.  Eyes: Conjunctivae and EOM are normal. Pupils are equal, round, and reactive to light.  Neck: Neck supple.  Cardiovascular: Normal rate, regular rhythm, normal heart sounds and intact distal pulses.   No murmur heard. Pulmonary/Chest: Effort normal and breath sounds normal. No respiratory distress. She has no wheezes. She has no rhonchi.  Musculoskeletal: Normal range of motion.  Neurological: She is alert and oriented to person, place, and time. Coordination normal.    Skin: Skin is warm and dry. No rash noted. She is not diaphoretic.  Psychiatric: She has a normal mood and affect. Her behavior is normal.  Nursing note and vitals reviewed.     Assessment & Plan:   Kim Webb is a 45 y.o. female Acute upper respiratory infection Cough  - likely prior viral URI with improving cough. Reassuring exam. Symptomatic care  If needed, but , should continue to improve. RTC precautions.  Screening-pulmonary TB - Plan: TB Skin Test  - PPD placed.    No orders of the defined types were placed in this encounter.   Patient Instructions  Return in 48-72 hours for PPD reading.  Cough should continue to improve.   Cough, Adult Coughing is a reflex that clears your throat and your airways. Coughing helps to heal and protect your lungs. It is normal to cough occasionally, but a cough that happens with other symptoms or lasts a long time may be a sign of a condition that needs treatment. A cough may last only 2-3 weeks (acute), or it may last longer than 8 weeks (chronic). CAUSES Coughing is commonly caused by:  Breathing in substances that irritate your lungs.  A viral or bacterial respiratory infection.  Allergies.  Asthma.  Postnasal drip.  Smoking.  Acid backing up from the stomach into the esophagus (gastroesophageal reflux).  Certain medicines.  Chronic lung problems, including COPD (or rarely, lung cancer).  Other medical conditions such as heart failure. HOME CARE INSTRUCTIONS  Pay attention to any changes in your symptoms. Take these actions to help with your discomfort:  Take medicines only as told by your health care provider.  If you were prescribed an antibiotic medicine, take it as told by your health care provider. Do not stop taking the antibiotic even if you start to feel better.  Talk with your health care provider before you take a cough suppressant medicine.  Drink enough fluid to keep your urine clear or pale  yellow.  If the air is dry, use a cold steam vaporizer or humidifier in your bedroom or your home to help loosen secretions.  Avoid anything that causes you to cough at work or at home.  If your cough is worse at night, try sleeping in a semi-upright position.  Avoid cigarette smoke. If you smoke, quit smoking. If you need help quitting, ask your health care provider.  Avoid caffeine.  Avoid alcohol.  Rest as needed. SEEK MEDICAL CARE IF:   You have new symptoms.  You cough up pus.  Your cough does not get better after 2-3 weeks, or your cough gets worse.  You cannot control your cough with suppressant medicines and you are losing sleep.  You develop pain that is getting worse or pain that is not controlled with pain medicines.  You have a fever.  You have unexplained weight loss.  You have night sweats.  SEEK IMMEDIATE MEDICAL CARE IF:  You cough up blood.  You have difficulty breathing.  Your heartbeat is very fast.   This information is not intended to replace advice given to you by your health care provider. Make sure you discuss any questions you have with your health care provider.   Document Released: 11/19/2010 Document Revised: 02/11/2015 Document Reviewed: 07/30/2014 Elsevier Interactive Patient Education [employer]      I personally performed the services described in this documentation, which was scribed in my presence. The recorded information has been reviewed and considered, and addended by me as needed.

## 2015-06-09 NOTE — Patient Instructions (Signed)
Return in 48-72 hours for PPD reading.  Cough should continue to improve.   Cough, Adult Coughing is a reflex that clears your throat and your airways. Coughing helps to heal and protect your lungs. It is normal to cough occasionally, but a cough that happens with other symptoms or lasts a long time may be a sign of a condition that needs treatment. A cough may last only 2-3 weeks (acute), or it may last longer than 8 weeks (chronic). CAUSES Coughing is commonly caused by:  Breathing in substances that irritate your lungs.  A viral or bacterial respiratory infection.  Allergies.  Asthma.  Postnasal drip.  Smoking.  Acid backing up from the stomach into the esophagus (gastroesophageal reflux).  Certain medicines.  Chronic lung problems, including COPD (or rarely, lung cancer).  Other medical conditions such as heart failure. HOME CARE INSTRUCTIONS  Pay attention to any changes in your symptoms. Take these actions to help with your discomfort:  Take medicines only as told by your health care provider.  If you were prescribed an antibiotic medicine, take it as told by your health care provider. Do not stop taking the antibiotic even if you start to feel better.  Talk with your health care provider before you take a cough suppressant medicine.  Drink enough fluid to keep your urine clear or pale yellow.  If the air is dry, use a cold steam vaporizer or humidifier in your bedroom or your home to help loosen secretions.  Avoid anything that causes you to cough at work or at home.  If your cough is worse at night, try sleeping in a semi-upright position.  Avoid cigarette smoke. If you smoke, quit smoking. If you need help quitting, ask your health care provider.  Avoid caffeine.  Avoid alcohol.  Rest as needed. SEEK MEDICAL CARE IF:   You have new symptoms.  You cough up pus.  Your cough does not get better after 2-3 weeks, or your cough gets worse.  You cannot  control your cough with suppressant medicines and you are losing sleep.  You develop pain that is getting worse or pain that is not controlled with pain medicines.  You have a fever.  You have unexplained weight loss.  You have night sweats. SEEK IMMEDIATE MEDICAL CARE IF:  You cough up blood.  You have difficulty breathing.  Your heartbeat is very fast.   This information is not intended to replace advice given to you by your health care provider. Make sure you discuss any questions you have with your health care provider.   Document Released: 11/19/2010 Document Revised: 02/11/2015 Document Reviewed: 07/30/2014 Elsevier Interactive Patient Education [employer] Elsevier Inc.

## 2015-06-11 DIAGNOSIS — Z111 Encounter for screening for respiratory tuberculosis: Secondary | ICD-10-CM

## 2015-06-11 LAB — TB SKIN TEST
Induration: 0 mm
TB Skin Test: NEGATIVE

## 2015-06-21 ENCOUNTER — Ambulatory Visit (INDEPENDENT_AMBULATORY_CARE_PROVIDER_SITE_OTHER): Payer: Self-pay | Admitting: Family Medicine

## 2015-06-21 VITALS — BP 130/82 | HR 90 | Temp 98.7°F | Resp 16 | Ht 67.0 in | Wt 252.2 lb

## 2015-06-21 DIAGNOSIS — Z0000 Encounter for general adult medical examination without abnormal findings: Secondary | ICD-10-CM

## 2015-06-21 NOTE — Progress Notes (Signed)
Patient ID: Kim Webb, female   DOB: 04/19/1971, 44 y.o.   MRN: [id number]   This chart was scribed for Kim Sidle, MD by Kim Webb, medical scribe at [facility].The patient was seen in exam room 12 and the patient's care was started at 2:48 PM.  Patient ID: Kim Webb MRN: [id number], DOB: Mar 11, 1971, 45 y.o. Date of Encounter: 06/21/2015  Primary Physician: Kim Rowan, MD  Chief Complaint:  Chief Complaint  Patient presents with  . Annual Exam    Admin   HPI:  Kim Webb is a 45 y.o. female who presents to [facility] for an admin physical exam. 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. Began exercising Dec 1st, she has lost 5 pounds since. Wear corrected glasses, eye exam recently.  Past Medical History  Diagnosis Date  . Asthma   . Genital warts   . PCOS (polycystic ovarian syndrome)   . Anemia     Home Meds: Prior to Admission medications   Medication Sig Start Date End Date Taking? Authorizing Provider  cetirizine (ZYRTEC) 10 MG tablet Take 10 mg by mouth daily. Reported on 06/21/2015    Historical Provider, MD  fluticasone (FLONASE) 50 MCG/ACT nasal spray Place 2 sprays into both nostrils daily. Patient not taking: Reported on 06/21/2015 05/24/15   Kim Najjar, MD  HYDROcodone-homatropine [facility]) 5-1.5 MG/5ML syrup Take 5 mLs by mouth every 4 (four) hours as needed. Patient not taking: Reported on 06/21/2015 05/24/15   Kim Najjar, MD   Allergies: No Known Allergies  Social History   Social History  . Marital Status: Married    Spouse Name: N/A  . Number of Children: N/A  . Years of Education: N/A   Occupational History  . Not on file.   Social History Main Topics  . Smoking status: Never Smoker   . Smokeless tobacco: Never Used  . Alcohol Use: No  . Drug Use: No  . Sexual Activity: Not on file   Other Topics Concern  . Not on file   Social  History Narrative    Review of Systems: Constitutional: negative for chills, fever, night sweats, weight changes, or fatigue  HEENT: negative for vision changes, hearing loss, congestion, rhinorrhea, ST, epistaxis, or sinus pressure Cardiovascular: negative for chest pain or palpitations Respiratory: negative for hemoptysis, wheezing, shortness of breath, or cough Abdominal: negative for abdominal pain, nausea, vomiting, diarrhea, or constipation Dermatological: negative for rash Neurologic: negative for headache, dizziness, or syncope All other systems reviewed and are otherwise negative with the exception to those above and in the HPI.  Physical Exam: Blood pressure 130/82, pulse 90, temperature 98.7 F (37.1 C), temperature source Oral, resp. rate 16, height 5\' 7"  (1.702 m), weight 252 lb 3.2 oz (114.397 kg), last menstrual period 06/14/2015, SpO2 95 %., Body mass index is 39.49 kg/(m^2). General: Well developed, well nourished, in no acute distress. Head: Normocephalic, atraumatic, eyes without discharge, sclera non-icteric, nares are without discharge. Bilateral auditory canals clear, TM's are without perforation, pearly grey and translucent with reflective cone of light bilaterally. Oral cavity moist, posterior pharynx without exudate, erythema, peritonsillar abscess, or post nasal drip.  Neck: Supple. No thyromegaly. Full ROM. No lymphadenopathy. Lungs: Clear bilaterally to auscultation without wheezes, rales, or rhonchi. Breathing is unlabored. Heart: RRR with S1 S2. No murmurs, rubs, or gallops appreciated. Abdomen: Soft, non-tender, non-distended with normoactive bowel sounds. No hepatomegaly. No rebound/guarding. No obvious abdominal masses. Msk:  Strength and tone normal for age. Extremities/Skin: Warm and dry. No clubbing or cyanosis. No edema. No rashes or suspicious lesions. Neuro: Alert and oriented X 3. Moves all extremities spontaneously. Gait is normal. CNII-XII grossly in  tact. Psych:  Responds to questions appropriately with a normal affect.    ASSESSMENT AND PLAN:  45 y.o. year old female with no active problems  By signing my name below, I, Kim Webb, attest that this documentation has been prepared under the direction and in the presence of Kim SidleKurt Emmelia Holdsworth, MD.  Electronically Signed: Conchita ParisNadim Webb, medical scribe. 06/21/2015 2:56 PM.  This chart was scribed in my presence and reviewed by me personally.    ICD-9-CM ICD-10-CM   1. Annual physical exam V70.0 Z00.00      Signed, Kim SidleKurt Hosam Mcfetridge, MD

## 2015-09-02 ENCOUNTER — Ambulatory Visit (INDEPENDENT_AMBULATORY_CARE_PROVIDER_SITE_OTHER): Payer: BC Managed Care – PPO

## 2015-09-02 ENCOUNTER — Ambulatory Visit (INDEPENDENT_AMBULATORY_CARE_PROVIDER_SITE_OTHER): Payer: BC Managed Care – PPO | Admitting: Emergency Medicine

## 2015-09-02 VITALS — BP 114/80 | HR 82 | Temp 98.2°F | Resp 18 | Ht 67.0 in | Wt 249.4 lb

## 2015-09-02 DIAGNOSIS — M79671 Pain in right foot: Secondary | ICD-10-CM | POA: Diagnosis not present

## 2015-09-02 DIAGNOSIS — Z131 Encounter for screening for diabetes mellitus: Secondary | ICD-10-CM

## 2015-09-02 DIAGNOSIS — R0789 Other chest pain: Secondary | ICD-10-CM | POA: Diagnosis not present

## 2015-09-02 DIAGNOSIS — M79672 Pain in left foot: Secondary | ICD-10-CM | POA: Diagnosis not present

## 2015-09-02 LAB — POCT CBC
Granulocyte percent: 53.4 %G (ref 37–80)
HCT, POC: 37.4 % — AB (ref 37.7–47.9)
Hemoglobin: 13.3 g/dL (ref 12.2–16.2)
Lymph, poc: 2.5 (ref 0.6–3.4)
MCH: 31.4 pg — AB (ref 27–31.2)
MCHC: 35.4 g/dL (ref 31.8–35.4)
MCV: 88.6 fL (ref 80–97)
MID (CBC): 0.4 (ref 0–0.9)
MPV: 6.8 fL (ref 0–99.8)
POC Granulocyte: 3.3 (ref 2–6.9)
POC LYMPH PERCENT: 40.6 %L (ref 10–50)
POC MID %: 6 % (ref 0–12)
Platelet Count, POC: 214 10*3/uL (ref 142–424)
RBC: 4.23 M/uL (ref 4.04–5.48)
RDW, POC: 14.5 %
WBC: 6.2 10*3/uL (ref 4.6–10.2)

## 2015-09-02 LAB — COMPLETE METABOLIC PANEL WITH GFR
ALT: 11 U/L (ref 6–29)
AST: 14 U/L (ref 10–30)
Albumin: 4.1 g/dL (ref 3.6–5.1)
Alkaline Phosphatase: 63 U/L (ref 33–115)
BUN: 10 mg/dL (ref 7–25)
CALCIUM: 9.9 mg/dL (ref 8.6–10.2)
CO2: 26 mmol/L (ref 20–31)
CREATININE: 0.83 mg/dL (ref 0.50–1.10)
Chloride: 105 mmol/L (ref 98–110)
GFR, Est African American: >89 mL/min (ref >=60)
GFR, Est Non African American: 86 mL/min (ref >=60)
Glucose, Bld: 84 mg/dL (ref 65–99)
POTASSIUM: 4.6 mmol/L (ref 3.5–5.3)
Sodium: 139 mmol/L (ref 135–146)
Total Bilirubin: 0.5 mg/dL (ref 0.2–1.2)
Total Protein: 7 g/dL (ref 6.1–8.1)

## 2015-09-02 LAB — GLUCOSE, POCT (MANUAL RESULT ENTRY): POC GLUCOSE: 102 mg/dL — AB (ref 70–99)

## 2015-09-02 MED ORDER — MELOXICAM 7.5 MG PO TABS: 7.5000 mg | ORAL_TABLET | Freq: Every day | ORAL | Status: DC

## 2015-09-02 NOTE — Patient Instructions (Addendum)
I have referred she cardiology to evaluate her chest discomfort and EKG. I would advise you to take meloxicam for the discomfort in your feet and wear shoes with good support and cushioning. Blood screening for diabetes was normal.    IF you received an x-ray today, you will receive an invoice from [facility]. Please contact [facility] at [phone] with questions or concerns regarding your invoice.   IF you received labwork today, you will receive an invoice from [facility]. Please contact [facility] at [phone] with questions or concerns regarding your invoice.   Our billing staff will not be able to assist you with questions regarding bills from these companies.  You will be contacted with the lab results as soon as they are available. The fastest way to get your results is to activate your My Chart account. Instructions are located on the last page of this paperwork. If you have not heard from us regarding the results in 2 weeks, please contact this office.

## 2015-09-02 NOTE — Progress Notes (Addendum)
By signing my name below, I, [name], attest that this documentation has been prepared under the direction and in the presence of [name], MD. Electronically Signed: [name], Scribe. 09/02/2015 , 10:27 AM .  Patient was seen in room 10 .  Chief Complaint:  Chief Complaint  Patient presents with  . Chest Pain    this morning  . Foot Injury    left foot     HPI: Kim Webb is a 45 y.o. female who [facility] to [facility] today complaining of chest pain and feet pain.  She has regular periods. She denies chance of pregnancy. She's currently not sexually active.   Feet pain Patient states the bottom of left foot feels like athletic injury for a week. She also reports her right foot doesn't have the same pain but does notice a tingling sensation in her nerve when she brushes her hand across the top from her toes. She describes the sensation like "strumming the strings on a guitar".   Chest Pain She also had some chest pains noticed this morning. She informs it started with a little pressure and a quick sharp stab, then becomes an ache slightly below her left breast. She denies sweats, nausea, chills, shortness of breath, abdominal pain, or numbness.   She does snore when she sleeps. She had sleep apnea test done but it was normal. She is a non smoker. She had an EKG done a few years ago and it was normal.   Family History Her father was [id number] when he passed with heart disease. He was diagnosed with it in his 30s.   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   Past Medical History  Diagnosis Date  . Asthma   . Genital warts   . PCOS (polycystic ovarian syndrome)   . Anemia    Past Surgical History  Procedure Laterality Date  . Hysteroscopy      with polyp excision   Social History   Social History  . Marital Status: Married    Spouse Name: N/A  . Number of Children: N/A  . Years of Education: N/A   Social History  Main Topics  . Smoking status: Never Smoker   . Smokeless tobacco: Never Used  . Alcohol Use: No  . Drug Use: No  . Sexual Activity: Not Asked   Other Topics Concern  . None   Social History Narrative   Family History  Problem Relation Age of Onset  . Prostate cancer Father   . Prostate cancer Maternal Grandfather   . Hypertension Paternal Grandmother   . Cancer Paternal Grandfather   . Diabetes Father   . Hypertension Father   . CVA Paternal Grandmother   . Heart disease Father    No Known Allergies Prior to Admission medications   Medication Sig Start Date End Date Taking? Authorizing Provider  cetirizine (ZYRTEC) 10 MG tablet Take 10 mg by mouth daily. Reported on 06/21/2015    Historical Provider, MD  fluticasone (FLONASE) 50 MCG/ACT nasal spray Place 2 sprays into both nostrils daily. Patient not taking: Reported on 06/21/2015 05/24/15   Peyton Najjaravid H Hopper, MD     ROS:  Constitutional: negative for fever, chills, night sweats, weight changes, or fatigue  HEENT: negative for vision changes, hearing loss, congestion, rhinorrhea, ST, epistaxis, or sinus pressure Cardiovascular: negative for palpitations; positive for chest pain Respiratory: negative for hemoptysis, wheezing, shortness of breath, or cough Abdominal: negative  for abdominal pain, nausea, vomiting, diarrhea, or constipation Dermatological: negative for rash Musc: positive for myalgia (feet bilaterally) Neurologic: negative for numbness, headache, dizziness, or syncope All other systems reviewed and are otherwise negative with the exception to those above and in the HPI.  PHYSICAL EXAM: Filed Vitals:   09/02/15 0933  BP: 114/80  Pulse: 82  Temp: 98.2 F (36.8 C)  Resp: 18   Body mass index is 39.05 kg/(m^2).   General: Alert, no acute distress; Alert, not ill appearing HEENT:  Normocephalic, atraumatic, oropharynx patent. Eye: Nonie Hoyer [facility] Cardiovascular:  Regular rate and rhythm, no rubs or gallops.   faint grade 1 systolic murmur No Carotid bruits, radial pulse intact. No pedal edema.  Respiratory: Clear to auscultation bilaterally.  No wheezes, rales, or rhonchi.  No cyanosis, no use of accessory musculature Abdominal: No organomegaly, abdomen is soft and non-tender, positive bowel sounds. No masses. Musculoskeletal: Gait intact. Tenderness over base of left heel, right foot normal without tenderness Skin: No rashes. Neurologic: Facial musculature symmetric. Psychiatric: Patient acts appropriately throughout our interaction.  Lymphatic: No cervical or submandibular lymphadenopathy Genitourinary/Anorectal: No acute findings   LABS: Results for orders placed or performed in visit on 09/02/15  POCT CBC  Result Value Ref Range   WBC 6.2 4.6 - 10.2 K/uL   Lymph, poc 2.5 0.6 - 3.4   POC LYMPH PERCENT 40.6 10 - 50 %L   MID (cbc) 0.4 0 - 0.9   POC MID % 6.0 0 - 12 %M   POC Granulocyte 3.3 2 - 6.9   Granulocyte percent 53.4 37 - 80 %G   RBC 4.23 4.04 - 5.48 M/uL   Hemoglobin 13.3 12.2 - 16.2 g/dL   HCT, POC 16.1 (A) 09.6 - 47.9 %   MCV 88.6 80 - 97 fL   MCH, POC 31.4 (A) 27 - 31.2 pg   MCHC 35.4 31.8 - 35.4 g/dL   RDW, POC 04.5 %   Platelet Count, POC 214 142 - 424 K/uL   MPV 6.8 0 - 99.8 fL  POCT glucose (manual entry)  Result Value Ref Range   POC Glucose 102 (A) 70 - 99 mg/dl     EKG/XRAY:   EKG: 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 EXAM: CHEST  2 VIEW COMPARISON:  None in PACs FINDINGS: The lungs are mildly hyperinflated. There is no focal infiltrate. There is no pleural effusion. The heart and pulmonary vascularity are normal. The mediastinum is normal in width. The trachea is midline. The bony thorax exhibits no acute abnormality. IMPRESSION: Mild hyperinflation consistent with known reactive airway disease.  There is no evidence of pneumonia nor other acute cardiopulmonary abnormality. Electronically Signed   By: David  [place] M.D.   On: 09/02/2015 11:15   Dg Os Calcis Left  09/02/2015  CLINICAL DATA:  Heel pain of unknown origin. EXAM: LEFT OS CALCIS - 2+ VIEW COMPARISON:  None. FINDINGS: No acute bony or joint abnormality identified. No evidence of fracture or dislocation. Mild calcaneal spurring. IMPRESSION: Mild calcaneal spurring.  Otherwise no acute or focal abnormality. Electronically Signed   By: Maisie Fus  Register   On: 09/02/2015 11:17   Dg Foot 2 Views Right  09/02/2015  CLINICAL DATA:  Foot pain of unknown origin. EXAM: RIGHT FOOT - 2 VIEW COMPARISON:  None. FINDINGS:  There is no evidence of fracture or dislocation. There is no evidence of arthropathy or other focal bone abnormality. Soft tissues are unremarkable. IMPRESSION: Negative. Electronically Signed   By: Charlett Nose M.D.   On: 09/02/2015 11:27    ASSESSMENT/PLAN:  Patient's chest discomfort sounds musculoskeletal to me. Will give a trial of Mobic 7.5 one to 2 daily. Her EKG does have an R wave in V1 with T-wave inversion there are no acute changes seen. 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 Referral made to cardiology for their help. In the interim she will take a baby aspirin one a day also. I advised her to switch over to tennis shoes with good heel support and cushioning.I personally performed the services described in this documentation, which was scribed in my presence. The recorded information has been reviewed and is accurate.of note the patient has been working out hard ty and lose weight and certainly this ct home which she lists and also may have contributed to her chest discomfort.  Gross sideeffects, risk and benefits, and alternatives of medications d/w patient. Patient is aware that all medications have potential sideeffects and we are unable to predict every sideeffect or drug-drug interaction  that may occur.  Lesle Chris MD 09/02/2015 10:27 AM

## 2015-09-13 ENCOUNTER — Ambulatory Visit (INDEPENDENT_AMBULATORY_CARE_PROVIDER_SITE_OTHER): Payer: BC Managed Care – PPO

## 2015-09-13 ENCOUNTER — Ambulatory Visit (INDEPENDENT_AMBULATORY_CARE_PROVIDER_SITE_OTHER): Payer: BC Managed Care – PPO | Admitting: Physician Assistant

## 2015-09-13 VITALS — BP 122/80 | HR 71 | Temp 97.9°F | Resp 16 | Ht 67.0 in | Wt 248.0 lb

## 2015-09-13 DIAGNOSIS — S86012A Strain of left Achilles tendon, initial encounter: Secondary | ICD-10-CM | POA: Diagnosis not present

## 2015-09-13 MED ORDER — IBUPROFEN 800 MG PO TABS: 800.0000 mg | ORAL_TABLET | Freq: Three times a day (TID) | ORAL | Status: AC

## 2015-09-13 NOTE — Progress Notes (Signed)
09/13/2015 11:16 AM   DOB: May 02, 1971 / MRN: [id number]  SUBJECTIVE:  Kim Webb is a 45 y.o. female presenting for left heel pain that started after doing "sprinting."  Reports while sprint she heard a loud pop and fell to the ground.  Since that time she has had difficulty ambulating, particularly with active dorsiflexion.  Denies bony ankle pain.  Denies the recent use of flouroquinolones and CHL shows no prescriptions for this class of medications.    She has No Known Allergies.   She  has a past medical history of Asthma; Genital warts; PCOS (polycystic ovarian syndrome); and Anemia.    She  reports that she has never smoked. She has never used smokeless tobacco. She reports that she does not drink alcohol or use illicit drugs. She  has no sexual activity history on file. The patient  has past surgical history that includes Hysteroscopy.  Her family history includes CVA in her paternal grandmother; Cancer in her paternal grandfather; Diabetes in her father; Heart disease in her father; Hypertension in her father and paternal grandmother; Prostate cancer in her father and maternal grandfather.  Review of Systems  Constitutional: Negative for fever.  Respiratory: Negative for cough.   Cardiovascular: Negative for chest pain.  Musculoskeletal: Positive for myalgias and joint pain.  Neurological: Negative for dizziness.    Problem list and medications reviewed and updated by myself where necessary, and exist elsewhere in the encounter.   OBJECTIVE:  BP 122/80 mmHg  Pulse 71  Temp(Src) 97.9 F (36.6 C) (Oral)  Resp 16  Ht  (1.702 m)  Wt 248 lb (112.492 kg)  BMI 38.83 kg/m2  SpO2 99%  LMP 09/06/2015  Physical Exam  Constitutional: She is oriented to person, place, and time. She appears well-developed.  Eyes: EOM are normal. Pupils are equal, round, and reactive to light.  Cardiovascular: Normal rate.   Pulmonary/Chest: Effort normal.  Abdominal: She exhibits no  distension.  Musculoskeletal: Normal range of motion.       Feet:  Neurological: She is alert and oriented to person, place, and time. No cranial nerve deficit.  Skin: Skin is warm and dry. She is not diaphoretic.  Psychiatric: She has a normal mood and affect.  Vitals reviewed.   No results found for this or any previous visit (from the past 72 hour(s)).  Dg Ankle Complete Left  09/13/2015  CLINICAL DATA:  Heel pain EXAM: LEFT ANKLE COMPLETE - 3+ VIEW COMPARISON:  09/02/2015 FINDINGS: There is no evidence of fracture, dislocation, or joint effusion. Plantar heel spur noted. There is no evidence of arthropathy or other focal bone abnormality. Soft tissues are unremarkable. IMPRESSION: Heel spur. Electronically Signed   By: Signa Kell M.D.   On: 09/13/2015 11:01    ASSESSMENT AND PLAN  Kim Webb was seen today for ankle pain.  Diagnoses and all orders for this visit:  Achilles rupture, left, initial encounter: I have placed her in an anterior plantar flexion splint.  She is provided with crutches.  Will get her in with orthopedics hopefully this week.  800 mg Ibuprofen TID for now.   -     DG Ankle Complete Left; Future -     Ambulatory referral to Orthopedic Surgery -     ibuprofen (ADVIL,MOTRIN) 800 MG tablet; Take 1 tablet (800 mg total) by mouth 3 (three) times daily. Take with food.  Do not take Aleve or Goody's while taking this medication.    The patient was advised to  call or return to clinic if she does not see an improvement in symptoms or to seek the care of the closest emergency department if she worsens with the above plan.   Deliah BostonMichael Daneen Volcy, MHS, PA-C [facility] 09/13/2015 11:16 AM

## 2015-09-13 NOTE — Patient Instructions (Signed)
     IF you received an x-ray today, you will receive an invoice from [facility]. Please contact [facility] at [phone] with questions or concerns regarding your invoice.   IF you received labwork today, you will receive an invoice from [facility]. Please contact [facility] at [phone] with questions or concerns regarding your invoice.   Our billing staff will not be able to assist you with questions regarding bills from these companies.  You will be contacted with the lab results as soon as they are available. The fastest way to get your results is to activate your My Chart account. Instructions are located on the last page of this paperwork. If you have not heard from us regarding the results in 2 weeks, please contact this office.      

## 2015-09-14 ENCOUNTER — Telehealth: Payer: Self-pay

## 2015-09-14 ENCOUNTER — Other Ambulatory Visit: Payer: Self-pay

## 2015-09-14 DIAGNOSIS — S86012A Strain of left Achilles tendon, initial encounter: Secondary | ICD-10-CM

## 2015-09-14 NOTE — Telephone Encounter (Signed)
Pt. Came back into the office today after being seen by Deliah BostonMichael Clark on 09/13/15. She stated that it was hard for her to use her crutches around campus and that she would prefer a wheelchair. Per Dr. Merla Richesoolittle, an order for a wheelchair was printed and signed. Will need to check with her insurance in regards to coverage and any copays.

## 2016-01-05 ENCOUNTER — Encounter: Payer: Self-pay | Admitting: Obstetrics & Gynecology

## 2016-01-05 ENCOUNTER — Ambulatory Visit (INDEPENDENT_AMBULATORY_CARE_PROVIDER_SITE_OTHER): Payer: BC Managed Care – PPO | Admitting: Obstetrics & Gynecology

## 2016-01-05 VITALS — BP 124/80 | HR 70 | Resp 18 | Ht 66.5 in | Wt 246.0 lb

## 2016-01-05 DIAGNOSIS — R21 Rash and other nonspecific skin eruption: Secondary | ICD-10-CM

## 2016-01-05 DIAGNOSIS — Z01419 Encounter for gynecological examination (general) (routine) without abnormal findings: Secondary | ICD-10-CM

## 2016-01-05 DIAGNOSIS — Z0000 Encounter for general adult medical examination without abnormal findings: Secondary | ICD-10-CM

## 2016-01-05 DIAGNOSIS — Z124 Encounter for screening for malignant neoplasm of cervix: Secondary | ICD-10-CM

## 2016-01-05 MED ORDER — METHYLPREDNISOLONE 4 MG PO TBPK: ORAL_TABLET | ORAL | 0 refills | Status: DC

## 2016-01-05 NOTE — Progress Notes (Signed)
45 y.o. G1P1001 MarriedAfrican AmericanF here for annual exam.  Has moved back to [facility] and is working here full time.  Husband has decided to proceed with divorce.  She is grieving from this.    Reports over the weekend, she got into a bush of some sort.  Now with itchy patches on both arms and upper right thigh.  Doesn't seem to be spreading.  Went to urgent care.  Benadryl and topical calamine lotion recommended.  Not using the benadryl because it makes her too sleepy with her son at night.  He is 3.  Cycles are regular.    PCP:  Dr. Duanne Guess  Patient's last menstrual period was 12/31/2015.          Sexually active: No.  The current method of family planning is abstinence.    Exercising: No.  The patient does not participate in regular exercise at present. Smoker:  no  Health Maintenance: Pap:  Unsure ~2016 Normal  History of abnormal Pap:  no MMG:  07/03/13 BIRADS1:neg Colonoscopy:  10/10/08 Normal - f/u at age 38 BMD:   Never TDaP:  Current  Pneumonia vaccine(s):  No Zostavax:   No Hep C testing: 09/01/14 Neg Screening Labs: PCP   reports that she has never smoked. She has never used smokeless tobacco. She reports that she does not drink alcohol or use drugs.  Past Medical History:  Diagnosis Date  . Anemia   . Asthma   . Genital warts   . PCOS (polycystic ovarian syndrome)     Past Surgical History:  Procedure Laterality Date  . HYSTEROSCOPY     with polyp excision    Current Outpatient Prescriptions  Medication Sig Dispense Refill  . albuterol (PROVENTIL HFA;VENTOLIN HFA) 108 (90 Base) MCG/ACT inhaler Inhale into the lungs daily as needed.    . cetirizine (ZYRTEC) 10 MG tablet Take 10 mg by mouth daily. Reported on 09/13/2015    . Multiple Vitamin (MULTI-VITAMINS) TABS Take by mouth daily.     No current facility-administered medications for this visit.     Family History  Problem Relation Age of Onset  . Prostate cancer Father   . Diabetes Father   .  Hypertension Father   . Heart disease Father   . Prostate cancer Maternal Grandfather   . Hypertension Paternal Grandmother   . CVA Paternal Grandmother   . Cancer Paternal Grandfather     ROS:  Pertinent items are noted in HPI.  Otherwise, a comprehensive ROS was negative.  Exam:   BP 124/80 (BP Location: Left Arm, Patient Position: Sitting, Cuff Size: Large)   Pulse 70   Resp 18   Ht 5' 6.5" (1.689 m)   Wt 246 lb (111.6 kg)   LMP 12/31/2015   BMI 39.11 kg/m     Height: 5' 6.5" (168.9 cm)  Ht Readings from Last 3 Encounters:  01/05/16 5' 6.5" (1.689 m)  09/13/15  (1.702 m)  09/02/15  (1.702 m)    General appearance: alert, cooperative and appears stated age Head: Normocephalic, without obvious abnormality, atraumatic Neck: no adenopathy, supple, symmetrical, trachea midline and thyroid normal to inspection and palpation Lungs: clear to auscultation bilaterally Breasts: normal appearance, no masses or tenderness Heart: regular rate and rhythm Abdomen: soft, non-tender; bowel sounds normal; no masses,  no organomegaly Extremities: extremities normal, atraumatic, no cyanosis or edema Skin: Skin color, texture, turgor normal. Multiple raised, erythematous lesions on both arms and right thigh Lymph nodes: Cervical, supraclavicular, and axillary  nodes normal. No abnormal inguinal nodes palpated Neurologic: Grossly normal   Pelvic: External genitalia:  no lesions              Urethra:  normal appearing urethra with no masses, tenderness or lesions              Bartholins and Skenes: normal                 Vagina: normal appearing vagina with normal color and discharge, no lesions              Cervix: no lesions              Pap taken: Yes.   Bimanual Exam:  Uterus:  normal size, contour, position, consistency, mobility, non-tender              Adnexa: normal adnexa and no mass, fullness, tenderness               Rectovaginal: Confirms               Anus:  normal  sphincter tone, no lesions  Chaperone was present for exam.  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  Information provided. pap smear and HR HPV Full STD testing done within last year Medrol dose pack to pharmacy return annually or prn  ~Prescription faxed on 01/05/16 -[facility]

## 2016-01-07 LAB — IPS PAP TEST WITH HPV

## 2017-04-06 ENCOUNTER — Encounter: Payer: Self-pay | Admitting: Obstetrics & Gynecology

## 2017-04-06 ENCOUNTER — Ambulatory Visit: Payer: BC Managed Care – PPO | Admitting: Obstetrics & Gynecology

## 2017-04-06 NOTE — Progress Notes (Deleted)
46 y.o. 631P1001 Married [place] here for annual exam.    No LMP recorded.          Sexually active: {yes no:314532}  The current method of family planning is {contraception:315051}.    Exercising: {yes no:314532}  {types:19826} Smoker:  no  Health Maintenance: Pap:  01/05/16 negative, HR HPV negative  History of abnormal Pap:  no MMG:  07/01/13 BIRADS 1 negative  Colonoscopy:  10/10/08 normal- repeat at age 46  BMD:   *** TDaP:  UTD  Pneumonia vaccine(s):  *** Zostavax:   *** Hep C testing: 09/01/14 negative  Screening Labs: ***, Hb today: ***, Urine today: ***   reports that she has never smoked. She has never used smokeless tobacco. She reports that she does not drink alcohol or use drugs.  Past Medical History:  Diagnosis Date  . Anemia   . Asthma   . Genital warts   . PCOS (polycystic ovarian syndrome)     Past Surgical History:  Procedure Laterality Date  . HYSTEROSCOPY     with polyp excision    Current Outpatient Prescriptions  Medication Sig Dispense Refill  . albuterol (PROVENTIL HFA;VENTOLIN HFA) 108 (90 Base) MCG/ACT inhaler Inhale into the lungs daily as needed.    . cetirizine (ZYRTEC) 10 MG tablet Take 10 mg by mouth daily. Reported on 09/13/2015    . diphenhydrAMINE (BENADRYL) 50 MG capsule Take 50 mg by mouth every 6 (six) hours as needed.    . methylPREDNISolone (MEDROL DOSEPAK) 4 MG TBPK tablet Take as directed 21 tablet 0  . Multiple Vitamin (MULTI-VITAMINS) TABS Take by mouth daily.     No current facility-administered medications for this visit.     Family History  Problem Relation Age of Onset  . Prostate cancer Father   . Diabetes Father   . Hypertension Father   . Heart disease Father   . Prostate cancer Maternal Grandfather   . Hypertension Paternal Grandmother   . CVA Paternal Grandmother   . Cancer Paternal Grandfather     ROS:  Pertinent items are noted in HPI.  Otherwise, a comprehensive ROS was negative.  Exam:   There  were no vitals taken for this visit.  Weight change: @WEIGHTCHANGE @ Height:      Ht Readings from Last 3 Encounters:  01/05/16 5' 6.5" (1.689 m)  09/13/15 5\' 7"  (1.702 m)  09/02/15 5\' 7"  (1.702 m)    General appearance: alert, cooperative and appears stated age Head: Normocephalic, without obvious abnormality, atraumatic Neck: no adenopathy, supple, symmetrical, trachea midline and thyroid {EXAM; THYROID:18604} Lungs: clear to auscultation bilaterally Breasts: {Exam; breast:13139::"normal appearance, no masses or tenderness"} Heart: regular rate and rhythm Abdomen: soft, non-tender; bowel sounds normal; no masses,  no organomegaly Extremities: extremities normal, atraumatic, no cyanosis or edema Skin: Skin color, texture, turgor normal. No rashes or lesions Lymph nodes: Cervical, supraclavicular, and axillary nodes normal. No abnormal inguinal nodes palpated Neurologic: Grossly normal   Pelvic: External genitalia:  no lesions              Urethra:  normal appearing urethra with no masses, tenderness or lesions              Bartholins and Skenes: normal                 Vagina: normal appearing vagina with normal color and discharge, no lesions              Cervix: {exam; cervix:14595}  Pap taken: {yes no:314532} Bimanual Exam:  Uterus:  {exam; uterus:12215}              Adnexa: {exam; adnexa:12223}               Rectovaginal: Confirms               Anus:  normal sphincter tone, no lesions  Chaperone was present for exam.  A:  Well Woman with normal exam  P:   {plan; gyn:5269::"mammogram","pap smear","return annually or prn"}

## 2017-06-15 ENCOUNTER — Other Ambulatory Visit: Payer: Self-pay | Admitting: Family Medicine

## 2017-06-15 DIAGNOSIS — R109 Unspecified abdominal pain: Secondary | ICD-10-CM

## 2017-06-22 ENCOUNTER — Other Ambulatory Visit: Payer: Self-pay | Admitting: Family Medicine

## 2017-06-22 MED ORDER — OSELTAMIVIR PHOSPHATE 75 MG PO CAPS: 75.0000 mg | ORAL_CAPSULE | Freq: Every day | ORAL | 0 refills | Status: AC

## 2017-06-22 NOTE — Progress Notes (Signed)
Prophylaxis for Influenza type A

## 2017-07-03 ENCOUNTER — Ambulatory Visit
Admission: RE | Admit: 2017-07-03 | Discharge: 2017-07-03 | Disposition: A | Discharge status: Home / Self Care | Payer: BC Managed Care – PPO | Source: Ambulatory Visit | Attending: Family Medicine | Admitting: Family Medicine

## 2017-07-03 DIAGNOSIS — R109 Unspecified abdominal pain: Secondary | ICD-10-CM

## 2017-07-11 ENCOUNTER — Other Ambulatory Visit: Payer: Self-pay | Admitting: Family Medicine

## 2017-07-11 DIAGNOSIS — Z1231 Encounter for screening mammogram for malignant neoplasm of breast: Secondary | ICD-10-CM

## 2017-07-25 ENCOUNTER — Ambulatory Visit: Payer: BC Managed Care – PPO | Admitting: Internal Medicine

## 2017-08-09 ENCOUNTER — Ambulatory Visit: Payer: BC Managed Care – PPO | Admitting: Family Medicine

## 2017-08-09 ENCOUNTER — Other Ambulatory Visit: Payer: Self-pay

## 2017-08-09 ENCOUNTER — Encounter: Payer: Self-pay | Admitting: Family Medicine

## 2017-08-09 VITALS — BP 125/80 | HR 79 | Temp 98.5°F | Resp 18 | Ht 66.6 in | Wt 245.4 lb

## 2017-08-09 DIAGNOSIS — J0120 Acute ethmoidal sinusitis, unspecified: Secondary | ICD-10-CM | POA: Diagnosis not present

## 2017-08-09 DIAGNOSIS — J020 Streptococcal pharyngitis: Secondary | ICD-10-CM

## 2017-08-09 DIAGNOSIS — J029 Acute pharyngitis, unspecified: Secondary | ICD-10-CM

## 2017-08-09 LAB — POCT RAPID STREP A (OFFICE): Rapid Strep A Screen: POSITIVE — AB

## 2017-08-09 MED ORDER — AMOXICILLIN-POT CLAVULANATE 875-125 MG PO TABS: 1.0000 | ORAL_TABLET | Freq: Two times a day (BID) | ORAL | 0 refills | Status: DC

## 2017-08-09 MED ORDER — HYDROCOD POLST-CPM POLST ER 10-8 MG/5ML PO SUER: 5.0000 mL | Freq: Two times a day (BID) | ORAL | 0 refills | Status: DC | PRN

## 2017-08-09 NOTE — Progress Notes (Signed)
Subjective:  By signing my name below, I, [name], attest that this documentation has been prepared under the direction and in the presence of Norberto SorensonEva Shaw, MD. Electronically Signed: [name], Scribe. 08/09/2017 , 6:35 PM .  Patient was seen in Room 6 .   Patient ID: Kim Webb, female    DOB: April 16, 1971, 47 y.o.   MRN: [id number] Chief Complaint  Patient presents with  . Sore Throat    x1 week, pt states it hurts to swallow, and enlarged lymph nodes. Pt states son was Dx with strep x3 weeks ago.   HPI Kim Webb is a 47 y.o. female who presents to Primary Care at [facility] complaining of sore throat with associated pain with swallowing and enlarged lymph nodes that started about a week ago. She states it started as a mild pain in her throat, and felt swelling in her throat and her ears. She denies any known antibiotic allergies. She notes having some soreness in her enlarged lymph nodes. She informs that her son had strep throat as well, about 3 weeks ago. She mentions cough starting today, believe from throat irritation. She's been able to eat and drink. Patient states she had called her PCP Duanne Guess(Dewey, Christell ConstantElizabeth R, MD) and had antibiotics sent in prior to being seen.   She's still taking zyrtec every now and then. She has Flonase nasal spray but hasn't been using it.   Past Medical History:  Diagnosis Date  . Anemia   . Asthma   . Genital warts   . PCOS (polycystic ovarian syndrome)    Past Surgical History:  Procedure Laterality Date  . HYSTEROSCOPY     with polyp excision   Prior to Admission medications   Medication Sig Start Date End Date Taking? Authorizing Provider  albuterol (PROVENTIL HFA;VENTOLIN HFA) 108 (90 Base) MCG/ACT inhaler Inhale into the lungs daily as needed. 05/13/15   [provider]  cetirizine (ZYRTEC) 10 MG tablet Take 10 mg by mouth daily. Reported on 09/13/2015    [provider]  diphenhydrAMINE (BENADRYL) 50 MG capsule  Take 50 mg by mouth every 6 (six) hours as needed.    [provider]  methylPREDNISolone (MEDROL DOSEPAK) 4 MG TBPK tablet Take as directed 01/05/16   Jerene BearsMiller, Mary S, MD  Multiple Vitamin (MULTI-VITAMINS) TABS Take by mouth daily.    [provider]   Allergies  Allergen Reactions  . Latex Itching    ? ? ?   Family History  Problem Relation Age of Onset  . Prostate cancer Father   . Diabetes Father   . Hypertension Father   . Heart disease Father   . Prostate cancer Maternal Grandfather   . Hypertension Paternal Grandmother   . CVA Paternal Grandmother   . Cancer Paternal Grandfather    Social History   Socioeconomic History  . Marital status: Married    Spouse name: None  . Number of children: None  . Years of education: None  . Highest education level: None  Social Needs  . Financial resource strain: None  . Food insecurity - worry: None  . Food insecurity - inability: None  . Transportation needs - medical: None  . Transportation needs - non-medical: None  Occupational History  . None  Tobacco Use  . Smoking status: Never Smoker  . Smokeless tobacco: Never Used  Substance and Sexual Activity  . Alcohol use: No  . Drug use: No  . Sexual activity: Not Currently    Birth  control/protection: Abstinence, Condom  Other Topics Concern  . None  Social History Narrative  . None   Depression screen [facility] 2/9 08/09/2017 09/13/2015 06/21/2015 06/09/2015 05/24/2015  Decreased Interest 0 0 0 0 0  Down, Depressed, Hopeless 0 0 0 0 0  PHQ - 2 Score 0 0 0 0 0    Review of Systems  Constitutional: Negative for appetite change, chills, fatigue, fever and unexpected weight change.  HENT: Positive for sore throat and trouble swallowing.   Respiratory: Positive for cough.   Gastrointestinal: Negative for constipation, diarrhea, nausea and vomiting.  Skin: Negative for rash and wound.  Neurological: Negative for dizziness, weakness and headaches.  Hematological:  Positive for adenopathy.       Objective:   Physical Exam  Constitutional: She is oriented to person, place, and time. She appears well-developed and well-nourished. No distress.  HENT:  Head: Normocephalic and atraumatic.  Mouth/Throat: Posterior oropharyngeal erythema present.  Eyes: EOM are normal. Pupils are equal, round, and reactive to light.  Neck: Neck supple.  Cardiovascular: Normal rate.  Pulmonary/Chest: Effort normal and breath sounds normal. No respiratory distress. She has no wheezes.  Musculoskeletal: Normal range of motion.  Lymphadenopathy:       Head (right side): Submandibular and tonsillar adenopathy present.       Head (left side): Submandibular and tonsillar adenopathy present.    She has cervical adenopathy (with slight tenderness).  Neurological: She is alert and oriented to person, place, and time.  Skin: Skin is warm and dry.  Psychiatric: She has a normal mood and affect. Her behavior is normal.  Nursing note and vitals reviewed.   BP 125/80 (BP Location: Right Arm, Patient Position: Sitting, Cuff Size: Large)   Pulse 79   Temp 98.5 F (36.9 C) (Oral)   Resp 18   Ht 5' 6.6" (1.692 m)   Wt 245 lb 6.4 oz (111.3 kg)   LMP 07/14/2017   SpO2 100%   BMI 38.90 kg/m   Results for orders placed or performed in visit on 08/09/17  POCT rapid strep A  Result Value Ref Range   Rapid Strep A Screen Positive (A) Negative       Assessment & Plan:   1. Streptococcal sore throat   2. Sore throat   3. Acute non-recurrent ethmoidal sinusitis     Orders Placed This Encounter  Procedures  . POCT rapid strep A    Meds ordered this encounter  Medications  . amoxicillin-clavulanate (AUGMENTIN) 875-125 MG tablet    Sig: Take 1 tablet by mouth 2 (two) times daily.    Dispense:  14 tablet    Refill:  0  . chlorpheniramine-HYDROcodone (TUSSIONEX PENNKINETIC ER) 10-8 MG/5ML SUER    Sig: Take 5 mLs by mouth every 12 (twelve) hours as needed.    Dispense:   120 mL    Refill:  0    I personally performed the services described in this documentation, which was scribed in my presence. The recorded information has been reviewed and considered, and addended by me as needed.   Norberto Sorenson, M.D.  Primary Care at [facility] [address] [place], [place] [address] [phone] phone [phone] fax  08/12/17 9:55 AM

## 2017-08-09 NOTE — Patient Instructions (Addendum)
I recommend frequent warm salt water gargles, hot tea with honey and lemon, rest, and handwashing.  Hot showers or breathing in steam may help loosen the congestion.  Using a netti pot or sinus rinse is also likely to help you feel better and keep this from progressing.  Use the fluticasone nasal spray every night before bed for at least 2 weeks.  If no improvement or you are getting worse, come back as you might need a course of steroids but hopefully with all of the above, you can avoid it.     IF you received an x-ray today, you will receive an invoice from [facility]. Please contact [facility] at [phone] with questions or concerns regarding your invoice.   IF you received labwork today, you will receive an invoice from [facility]. Please contact [facility] at [phone] with questions or concerns regarding your invoice.   Our billing staff will not be able to assist you with questions regarding bills from these companies.  You will be contacted with the lab results as soon as they are available. The fastest way to get your results is to activate your My Chart account. Instructions are located on the last page of this paperwork. If you have not heard from [place] regarding the results in 2 weeks, please contact this office.     Strep Throat Strep throat is a bacterial infection of the throat. Your health care provider may call the infection tonsillitis or pharyngitis, depending on whether there is swelling in the tonsils or at the back of the throat. Strep throat is most common during the cold months of the year in children who are 63-79 years of age, but it can happen during any season in people of any age. This infection is spread from person to person (contagious) through coughing, sneezing, or close contact. What are the causes? Strep throat is caused by the bacteria called Streptococcus pyogenes. What increases the risk? This condition is more likely to develop  in:  People who spend time in crowded places where the infection can spread easily.  People who have close contact with someone who has strep throat.  What are the signs or symptoms? Symptoms of this condition include:  Fever or chills.  Redness, swelling, or pain in the tonsils or throat.  Pain or difficulty when swallowing.  White or yellow spots on the tonsils or throat.  Swollen, tender glands in the neck or under the jaw.  Red rash all over the body (rare).  How is this diagnosed? This condition is diagnosed by performing a rapid strep test or by taking a swab of your throat (throat culture test). Results from a rapid strep test are usually ready in a few minutes, but throat culture test results are available after one or two days. How is this treated? This condition is treated with antibiotic medicine. Follow these instructions at home: Medicines  Take over-the-counter and prescription medicines only as told by your health care provider.  Take your antibiotic as told by your health care provider. Do not stop taking the antibiotic even if you start to feel better.  Have family members who also have a sore throat or fever tested for strep throat. They may need antibiotics if they have the strep infection. Eating and drinking  Do not share food, drinking cups, or personal items that could cause the infection to spread to other people.  If swallowing is difficult, try eating soft foods until your sore throat feels better.  Drink enough fluid to keep your urine clear or pale yellow. General instructions  Gargle with a salt-water mixture 3-4 times per day or as needed. To make a salt-water mixture, completely dissolve -1 tsp of salt in 1 cup of warm water.  Make sure that all household members wash their hands well.  Get plenty of rest.  Stay home from school or work until you have been taking antibiotics for 24 hours.  Keep all follow-up visits as told by your  health care provider. This is important. Contact a health care provider if:  The glands in your neck continue to get bigger.  You develop a rash, cough, or earache.  You cough up a thick liquid that is green, yellow-brown, or bloody.  You have pain or discomfort that does not get better with medicine.  Your problems seem to be getting worse rather than better.  You have a fever. Get help right away if:  You have new symptoms, such as vomiting, severe headache, stiff or painful neck, chest pain, or shortness of breath.  You have severe throat pain, drooling, or changes in your voice.  You have swelling of the neck, or the skin on the neck becomes red and tender.  You have signs of dehydration, such as fatigue, dry mouth, and decreased urination.  You become increasingly sleepy, or you cannot wake up completely.  Your joints become red or painful. This information is not intended to replace advice given to you by your health care provider. Make sure you discuss any questions you have with your health care provider. Document Released: 05/20/2000 Document Revised: 01/20/2016 Document Reviewed: 09/15/2014 Elsevier Interactive Patient Education  2018 [employer].  Sinusitis, Adult Sinusitis is soreness and inflammation of your sinuses. Sinuses are hollow spaces in the bones around your face. Your sinuses are located:  Around your eyes.  In the middle of your forehead.  Behind your nose.  In your cheekbones.  Your sinuses and nasal passages are lined with a stringy fluid (mucus). Mucus normally drains out of your sinuses. When your nasal tissues become inflamed or swollen, the mucus can become trapped or blocked so air cannot flow through your sinuses. This allows bacteria, viruses, and funguses to grow, which leads to infection. Sinusitis can develop quickly and last for 7?10 days (acute) or for more than 12 weeks (chronic). Sinusitis often develops after a cold. What are the  causes? This condition is caused by anything that creates swelling in the sinuses or stops mucus from draining, including:  Allergies.  Asthma.  Bacterial or viral infection.  Abnormally shaped bones between the nasal passages.  Nasal growths that contain mucus (nasal polyps).  Narrow sinus openings.  Pollutants, such as chemicals or irritants in the air.  A foreign object stuck in the nose.  A fungal infection. This is rare.  What increases the risk? The following factors may make you more likely to develop this condition:  Having allergies or asthma.  Having had a recent cold or respiratory tract infection.  Having structural deformities or blockages in your nose or sinuses.  Having a weak immune system.  Doing a lot of swimming or diving.  Overusing nasal sprays.  Smoking.  What are the signs or symptoms? The main symptoms of this condition are pain and a feeling of pressure around the affected sinuses. Other symptoms include:  Upper toothache.  Earache.  Headache.  Bad breath.  Decreased sense of smell and taste.  A cough that may get  worse at night.  Fatigue.  Fever.  Thick drainage from your nose. The drainage is often green and it may contain pus (purulent).  Stuffy nose or congestion.  Postnasal drip. This is when extra mucus collects in the throat or back of the nose.  Swelling and warmth over the affected sinuses.  Sore throat.  Sensitivity to light.  How is this diagnosed? This condition is diagnosed based on symptoms, a medical history, and a physical exam. To find out if your condition is acute or chronic, your health care provider may:  Look in your nose for signs of nasal polyps.  Tap over the affected sinus to check for signs of infection.  View the inside of your sinuses using an imaging device that has a light attached (endoscope).  If your health care provider suspects that you have chronic sinusitis, you may also:  Be  tested for allergies.  Have a sample of mucus taken from your nose (nasal culture) and checked for bacteria.  Have a mucus sample examined to see if your sinusitis is related to an allergy.  If your sinusitis does not respond to treatment and it lasts longer than 8 weeks, you may have an MRI or CT scan to check your sinuses. These scans also help to determine how severe your infection is. In rare cases, a bone biopsy may be done to rule out more serious types of fungal sinus disease. How is this treated? Treatment for sinusitis depends on the cause and whether your condition is chronic or acute. If a virus is causing your sinusitis, your symptoms will go away on their own within 10 days. You may be given medicines to relieve your symptoms, including:  Topical nasal decongestants. They shrink swollen nasal passages and let mucus drain from your sinuses.  Antihistamines. These drugs block inflammation that is triggered by allergies. This can help to ease swelling in your nose and sinuses.  Topical nasal corticosteroids. These are nasal sprays that ease inflammation and swelling in your nose and sinuses.  Nasal saline washes. These rinses can help to get rid of thick mucus in your nose.  If your condition is caused by bacteria, you will be given an antibiotic medicine. If your condition is caused by a fungus, you will be given an antifungal medicine. Surgery may be needed to correct underlying conditions, such as narrow nasal passages. Surgery may also be needed to remove polyps. Follow these instructions at home: Medicines  Take, use, or apply over-the-counter and prescription medicines only as told by your health care provider. These may include nasal sprays.  If you were prescribed an antibiotic medicine, take it as told by your health care provider. Do not stop taking the antibiotic even if you start to feel better. Hydrate and Humidify  Drink enough water to keep your urine clear or  pale yellow. Staying hydrated will help to thin your mucus.  Use a cool mist humidifier to keep the humidity level in your home above 50%.  Inhale steam for 10-15 minutes, 3-4 times a day or as told by your health care provider. You can do this in the bathroom while a hot shower is running.  Limit your exposure to cool or dry air. Rest  Rest as much as possible.  Sleep with your head raised (elevated).  Make sure to get enough sleep each night. General instructions  Apply a warm, moist washcloth to your face 3-4 times a day or as told by your health care provider.  This will help with discomfort.  Wash your hands often with soap and water to reduce your exposure to viruses and other germs. If soap and water are not available, use hand sanitizer.  Do not smoke. Avoid being around people who are smoking (secondhand smoke).  Keep all follow-up visits as told by your health care provider. This is important. Contact a health care provider if:  You have a fever.  Your symptoms get worse.  Your symptoms do not improve within 10 days. Get help right away if:  You have a severe headache.  You have persistent vomiting.  You have pain or swelling around your face or eyes.  You have vision problems.  You develop confusion.  Your neck is stiff.  You have trouble breathing. This information is not intended to replace advice given to you by your health care provider. Make sure you discuss any questions you have with your health care provider. Document Released: 05/23/2005 Document Revised: 01/17/2016 Document Reviewed: 03/18/2015 Elsevier Interactive Patient Education  [employer].

## 2017-08-29 ENCOUNTER — Ambulatory Visit: Payer: BC Managed Care – PPO | Admitting: Family Medicine

## 2017-09-11 ENCOUNTER — Ambulatory Visit: Payer: BC Managed Care – PPO

## 2017-10-31 ENCOUNTER — Ambulatory Visit: Payer: BC Managed Care – PPO | Admitting: Family Medicine

## 2017-10-31 ENCOUNTER — Encounter: Payer: Self-pay | Admitting: Family Medicine

## 2017-10-31 ENCOUNTER — Other Ambulatory Visit: Payer: Self-pay

## 2017-10-31 VITALS — BP 138/78 | HR 74 | Temp 98.4°F | Ht 67.0 in | Wt 250.2 lb

## 2017-10-31 DIAGNOSIS — J029 Acute pharyngitis, unspecified: Secondary | ICD-10-CM

## 2017-10-31 DIAGNOSIS — R590 Localized enlarged lymph nodes: Secondary | ICD-10-CM

## 2017-10-31 DIAGNOSIS — M542 Cervicalgia: Secondary | ICD-10-CM | POA: Diagnosis not present

## 2017-10-31 DIAGNOSIS — J020 Streptococcal pharyngitis: Secondary | ICD-10-CM

## 2017-10-31 LAB — POCT CBC
Granulocyte percent: 58.4 %G (ref 37–80)
HCT, POC: 40.2 % (ref 37.7–47.9)
Hemoglobin: 13 g/dL (ref 12.2–16.2)
Lymph, poc: 2.2 (ref 0.6–3.4)
MCH: 29.2 pg (ref 27–31.2)
MCHC: 32.2 g/dL (ref 31.8–35.4)
MCV: 90.7 fL (ref 80–97)
MID (cbc): 0.5 (ref 0–0.9)
MPV: 7.3 fL (ref 0–99.8)
POC GRANULOCYTE: 3.9 (ref 2–6.9)
POC LYMPH PERCENT: 33.5 %L (ref 10–50)
POC MID %: 8.1 % (ref 0–12)
Platelet Count, POC: 303 10*3/uL (ref 142–424)
RBC: 4.44 M/uL (ref 4.04–5.48)
RDW, POC: 14.6 %
WBC: 6.7 10*3/uL (ref 4.6–10.2)

## 2017-10-31 LAB — POCT RAPID STREP A (OFFICE): RAPID STREP A SCREEN: POSITIVE — AB

## 2017-10-31 MED ORDER — AMOXICILLIN 500 MG PO CAPS: 500.0000 mg | ORAL_CAPSULE | Freq: Three times a day (TID) | ORAL | 0 refills | Status: DC

## 2017-10-31 NOTE — Progress Notes (Signed)
Subjective:  By signing my name below, I, Stann Ore, attest that this documentation has been prepared under the direction and in the presence of Meredith Staggers, MD. Electronically Signed: Stann Ore, Scribe. 10/31/2017 , 12:44 PM .  Patient was seen in Room 8 .   Patient ID: Kim Webb, female    DOB: 1971-05-26, 47 y.o.   MRN: [id number] Chief Complaint  Patient presents with  . pain on neck    1 month. (end of a sore throat(3-4 days))    HPI Kim Webb is a 47 y.o. female  She was seen on March 6th for strep throat, treated with Augmentin and Tussionex; positive rapid strep at that time.   Patient states her strep throat had improved at that time. Afterwards, she went through stressors at work where she almost lost her job. Shortly after stressors at work resolved, she noticed having a pain under her left jaw that radiates to front neck, and travels back that started since the end of April. There's been a pain under her left jaw, rates pain 2-3/10, and pain travels forward to her chin sometimes. She's noticed pain occurs more when she's more anxious and stressed. She denies chest pain, trouble breathing, fever, night sweats or unexplained weight loss. She mentions slight left ear pressure without any new hearing loss or changes. She saw ENT in Feb 2017 by Dr. 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 Laryngoscopy was performed at that time, overall reassuring exam.   She also mentions having a slight sore throat. She did drink out of water bottles recently that she drank out of when she had strep.   Patient Active Problem List   Diagnosis Date Noted  . 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 HEMOCCULT POSITIVE STOOL 09/24/2008  . ANXIETY 09/22/2008  . DEPRESSION 09/22/2008  . BACK PAIN, LUMBAR 09/22/2008   Past Medical History:  Diagnosis Date  . Anemia   . Asthma   . Genital warts   . PCOS (polycystic ovarian syndrome)     Past Surgical History:  Procedure Laterality Date  . HYSTEROSCOPY     with polyp excision   Allergies  Allergen Reactions  . Prednisone Nausea Only   Prior to Admission medications   Medication Sig Start Date End Date Taking? Authorizing Provider  albuterol (PROVENTIL HFA;VENTOLIN HFA) 108 (90 Base) MCG/ACT inhaler Inhale into the lungs daily as needed. 05/13/15   [provider]  amoxicillin-clavulanate (AUGMENTIN) 875-125 MG tablet Take 1 tablet by mouth 2 (two) times daily. 08/09/17   Sherren Mocha, MD  cetirizine (ZYRTEC) 10 MG tablet Take 10 mg by mouth daily. Reported on 09/13/2015    [provider]  chlorpheniramine-HYDROcodone (TUSSIONEX PENNKINETIC ER) 10-8 MG/5ML SUER Take 5 mLs by mouth every 12 (twelve) hours as needed. 08/09/17   Sherren Mocha, MD  diphenhydrAMINE (BENADRYL) 50 MG capsule Take 50 mg by mouth every 6 (six) hours as needed.    [provider]  methylPREDNISolone (MEDROL DOSEPAK) 4 MG TBPK tablet Take as directed Patient not taking: Reported on 08/09/2017 01/05/16   Jerene Bears, MD  Multiple Vitamin (MULTI-VITAMINS) TABS Take by mouth daily.    [provider]   Social History   Socioeconomic History  . Marital status: Married    Spouse name: Not on file  . Number of children: Not on file  . Years of education: Not on file  . Highest education level: Not on file  Occupational History  .  Not on file  Social Needs  . Financial resource strain: Not on file  . Food insecurity:    Worry: Not on file    Inability: Not on file  . Transportation needs:    Medical: Not on file    Non-medical: Not on file  Tobacco Use  . Smoking status: Never Smoker  . Smokeless tobacco: Never Used  Substance and Sexual Activity  . Alcohol use: No  . Drug use: No  . Sexual activity: Not Currently    Birth control/protection: Abstinence, Condom  Lifestyle  . Physical activity:    Days per week: Not on file    Minutes per session: Not on  file  . Stress: Not on file  Relationships  . Social connections:    Talks on phone: Not on file    Gets together: Not on file    Attends religious service: Not on file    Active member of club or organization: Not on file    Attends meetings of clubs or organizations: Not on file    Relationship status: Not on file  . Intimate partner violence:    Fear of current or ex partner: Not on file    Emotionally abused: Not on file    Physically abused: Not on file    Forced sexual activity: Not on file  Other Topics Concern  . Not on file  Social History Narrative  . Not on file   Review of Systems  Constitutional: Negative for chills, fatigue, fever and unexpected weight change.  HENT: Positive for sore throat.   Respiratory: Negative for cough.   Gastrointestinal: Negative for constipation, diarrhea, nausea and vomiting.  Skin: Negative for rash and wound.  Neurological: Negative for dizziness, weakness and headaches.  Psychiatric/Behavioral: The patient is nervous/anxious.        Objective:   Physical Exam  Constitutional: She is oriented to person, place, and time. She appears well-developed and well-nourished. No distress.  HENT:  Head: Normocephalic and atraumatic.  Left Ear: Tympanic membrane and ear canal normal. No mastoid tenderness. Tympanic membrane is not erythematous.  TMJ non tender, no clicks, normal jaw motion  Eyes: Pupils are equal, round, and reactive to light. EOM are normal.  Neck: Neck supple. Carotid bruit is not present. No thyromegaly present.  Cardiovascular: Normal rate.  Pulmonary/Chest: Effort normal. No stridor. No respiratory distress.  Musculoskeletal: Normal range of motion.  Lymphadenopathy:  Right AC node possibly enlarged, no other LAD otherwise  Neurological: She is alert and oriented to person, place, and time.  Skin: Skin is warm and dry.  Psychiatric: She has a normal mood and affect. Her behavior is normal.  Nursing note and vitals  reviewed.   Vitals:   10/31/17 1145  BP: 138/78  Pulse: 74  Temp: 98.4 F (36.9 C)  TempSrc: Oral  SpO2: 99%  Weight: 250 lb 3.2 oz (113.5 kg)  Height:  (1.702 m)   Results for orders placed or performed in visit on 10/31/17  POCT CBC  Result Value Ref Range   WBC 6.7 4.6 - 10.2 K/uL   Lymph, poc 2.2 0.6 - 3.4   POC LYMPH PERCENT 33.5 10 - 50 %L   MID (cbc) 0.5 0 - 0.9   POC MID % 8.1 0 - 12 %M   POC Granulocyte 3.9 2 - 6.9   Granulocyte percent 58.4 37 - 80 %G   RBC 4.44 4.04 - 5.48 M/uL   Hemoglobin 13.0 12.2 - 16.2 g/dL  HCT, POC 40.2 37.7 - 47.9 %   MCV 90.7 80 - 97 fL   MCH, POC 29.2 27 - 31.2 pg   MCHC 32.2 31.8 - 35.4 g/dL   RDW, POC 25.3 %   Platelet Count, POC 303 142 - 424 K/uL   MPV 7.3 0 - 99.8 fL  POCT rapid strep A  Result Value Ref Range   Rapid Strep A Screen Positive (A) Negative       Assessment & Plan:    Kim Webb is a 47 y.o. female Neck pain on left side - Plan: POCT CBC, POCT rapid strep A  Sore throat - Plan: POCT rapid strep A, CANCELED: Culture, Group A Strep  Enlarged lymph node in neck - Plan: POCT CBC  Strep pharyngitis - Plan: amoxicillin (AMOXIL) 500 MG capsule  Positive strep testing, treat with amoxicillin.  If persistent sore throat or lymphadenopathy after completion of antibiotics, consider further evaluation for possible ENT eval.  Reassuring CBC at present.  Symptomatic care and RTC precautions were discussed.  Meds ordered this encounter  Medications  . amoxicillin (AMOXIL) 500 MG capsule    Sig: Take 1 capsule (500 mg total) by mouth 3 (three) times daily.    Dispense:  30 capsule    Refill:  0   Patient Instructions   Start antibiotic for strep. If pain is not improving in next week, we can look into other causes. Return to the clinic or go to the nearest emergency room if any of your symptoms worsen or new symptoms occur.    Strep Throat Strep throat is a bacterial infection of the throat. Your  health care provider may call the infection tonsillitis or pharyngitis, depending on whether there is swelling in the tonsils or at the back of the throat. Strep throat is most common during the cold months of the year in children who are 20-47 years of age, but it can happen during any season in people of any age. This infection is spread from person to person (contagious) through coughing, sneezing, or close contact. What are the causes? Strep throat is caused by the bacteria called Streptococcus pyogenes. What increases the risk? This condition is more likely to develop in:  People who spend time in crowded places where the infection can spread easily.  People who have close contact with someone who has strep throat.  What are the signs or symptoms? Symptoms of this condition include:  Fever or chills.  Redness, swelling, or pain in the tonsils or throat.  Pain or difficulty when swallowing.  White or yellow spots on the tonsils or throat.  Swollen, tender glands in the neck or under the jaw.  Red rash all over the body (rare).  How is this diagnosed? This condition is diagnosed by performing a rapid strep test or by taking a swab of your throat (throat culture test). Results from a rapid strep test are usually ready in a few minutes, but throat culture test results are available after one or two days. How is this treated? This condition is treated with antibiotic medicine. Follow these instructions at home: Medicines  Take over-the-counter and prescription medicines only as told by your health care provider.  Take your antibiotic as told by your health care provider. Do not stop taking the antibiotic even if you start to feel better.  Have family members who also have a sore throat or fever tested for strep throat. They may need antibiotics if they have the strep  infection. Eating and drinking  Do not share food, drinking cups, or personal items that could cause the  infection to spread to other people.  If swallowing is difficult, try eating soft foods until your sore throat feels better.  Drink enough fluid to keep your urine clear or pale yellow. General instructions  Gargle with a salt-water mixture 3-4 times per day or as needed. To make a salt-water mixture, completely dissolve -1 tsp of salt in 1 cup of warm water.  Make sure that all household members wash their hands well.  Get plenty of rest.  Stay home from school or work until you have been taking antibiotics for 24 hours.  Keep all follow-up visits as told by your health care provider. This is important. Contact a health care provider if:  The glands in your neck continue to get bigger.  You develop a rash, cough, or earache.  You cough up a thick liquid that is green, yellow-brown, or bloody.  You have pain or discomfort that does not get better with medicine.  Your problems seem to be getting worse rather than better.  You have a fever. Get help right away if:  You have new symptoms, such as vomiting, severe headache, stiff or painful neck, chest pain, or shortness of breath.  You have severe throat pain, drooling, or changes in your voice.  You have swelling of the neck, or the skin on the neck becomes red and tender.  You have signs of dehydration, such as fatigue, dry mouth, and decreased urination.  You become increasingly sleepy, or you cannot wake up completely.  Your joints become red or painful. This information is not intended to replace advice given to you by your health care provider. Make sure you discuss any questions you have with your health care provider. Document Released: 05/20/2000 Document Revised: 01/20/2016 Document Reviewed: 09/15/2014 Elsevier Interactive Patient Education  2018 [employer].   IF you received an x-ray today, you will receive an invoice from [facility]. Please contact [facility] at [phone] with  questions or concerns regarding your invoice.   IF you received labwork today, you will receive an invoice from [place] [facility]. Please contact [facility] at [phone] with questions or concerns regarding your invoice.   Our billing staff will not be able to assist you with questions regarding bills from these companies.  You will be contacted with the lab results as soon as they are available. The fastest way to get your results is to activate your My Chart account. Instructions are located on the last page of this paperwork. If you have not heard from [place] regarding the results in 2 weeks, please contact this office.       I personally performed the services described in this documentation, which was scribed in my presence. The recorded information has been reviewed and considered for accuracy and completeness, addended by me as needed, and agree with information above.  Signed,   Meredith Staggers, MD Primary Care at [facility].  10/31/17 1:48 PM

## 2017-10-31 NOTE — Patient Instructions (Addendum)
Start antibiotic for strep. If pain is not improving in next week, we can look into other causes. Return to the clinic or go to the nearest emergency room if any of your symptoms worsen or new symptoms occur.    Strep Throat Strep throat is a bacterial infection of the throat. Your health care provider may call the infection tonsillitis or pharyngitis, depending on whether there is swelling in the tonsils or at the back of the throat. Strep throat is most common during the cold months of the year in children who are 78-4 years of age, but it can happen during any season in people of any age. This infection is spread from person to person (contagious) through coughing, sneezing, or close contact. What are the causes? Strep throat is caused by the bacteria called Streptococcus pyogenes. What increases the risk? This condition is more likely to develop in:  People who spend time in crowded places where the infection can spread easily.  People who have close contact with someone who has strep throat.  What are the signs or symptoms? Symptoms of this condition include:  Fever or chills.  Redness, swelling, or pain in the tonsils or throat.  Pain or difficulty when swallowing.  White or yellow spots on the tonsils or throat.  Swollen, tender glands in the neck or under the jaw.  Red rash all over the body (rare).  How is this diagnosed? This condition is diagnosed by performing a rapid strep test or by taking a swab of your throat (throat culture test). Results from a rapid strep test are usually ready in a few minutes, but throat culture test results are available after one or two days. How is this treated? This condition is treated with antibiotic medicine. Follow these instructions at home: Medicines  Take over-the-counter and prescription medicines only as told by your health care provider.  Take your antibiotic as told by your health care provider. Do not stop taking the  antibiotic even if you start to feel better.  Have family members who also have a sore throat or fever tested for strep throat. They may need antibiotics if they have the strep infection. Eating and drinking  Do not share food, drinking cups, or personal items that could cause the infection to spread to other people.  If swallowing is difficult, try eating soft foods until your sore throat feels better.  Drink enough fluid to keep your urine clear or pale yellow. General instructions  Gargle with a salt-water mixture 3-4 times per day or as needed. To make a salt-water mixture, completely dissolve -1 tsp of salt in 1 cup of warm water.  Make sure that all household members wash their hands well.  Get plenty of rest.  Stay home from school or work until you have been taking antibiotics for 24 hours.  Keep all follow-up visits as told by your health care provider. This is important. Contact a health care provider if:  The glands in your neck continue to get bigger.  You develop a rash, cough, or earache.  You cough up a thick liquid that is green, yellow-brown, or bloody.  You have pain or discomfort that does not get better with medicine.  Your problems seem to be getting worse rather than better.  You have a fever. Get help right away if:  You have new symptoms, such as vomiting, severe headache, stiff or painful neck, chest pain, or shortness of breath.  You have severe throat pain, drooling,  or changes in your voice.  You have swelling of the neck, or the skin on the neck becomes red and tender.  You have signs of dehydration, such as fatigue, dry mouth, and decreased urination.  You become increasingly sleepy, or you cannot wake up completely.  Your joints become red or painful. This information is not intended to replace advice given to you by your health care provider. Make sure you discuss any questions you have with your health care provider. Document Released:  05/20/2000 Document Revised: 01/20/2016 Document Reviewed: 09/15/2014 Elsevier Interactive Patient Education  2018 [employer].   IF you received an x-ray today, you will receive an invoice from [facility]. Please contact [facility] at [phone] with questions or concerns regarding your invoice.   IF you received labwork today, you will receive an invoice from [place]. Please contact [facility] at [phone] with questions or concerns regarding your invoice.   Our billing staff will not be able to assist you with questions regarding bills from these companies.  You will be contacted with the lab results as soon as they are available. The fastest way to get your results is to activate your My Chart account. Instructions are located on the last page of this paperwork. If you have not heard from [place] regarding the results in 2 weeks, please contact this office.

## 2017-12-04 ENCOUNTER — Other Ambulatory Visit: Payer: Self-pay | Admitting: Internal Medicine

## 2017-12-04 DIAGNOSIS — Z1231 Encounter for screening mammogram for malignant neoplasm of breast: Secondary | ICD-10-CM

## 2017-12-11 ENCOUNTER — Ambulatory Visit
Admission: RE | Admit: 2017-12-11 | Discharge: 2017-12-11 | Disposition: A | Discharge status: Home / Self Care | Payer: BC Managed Care – PPO | Source: Ambulatory Visit | Attending: Internal Medicine | Admitting: Internal Medicine

## 2017-12-11 DIAGNOSIS — Z1231 Encounter for screening mammogram for malignant neoplasm of breast: Secondary | ICD-10-CM

## 2017-12-12 ENCOUNTER — Other Ambulatory Visit: Payer: Self-pay | Admitting: Internal Medicine

## 2017-12-12 DIAGNOSIS — R928 Other abnormal and inconclusive findings on diagnostic imaging of breast: Secondary | ICD-10-CM

## 2017-12-14 ENCOUNTER — Ambulatory Visit
Admission: RE | Admit: 2017-12-14 | Discharge: 2017-12-14 | Disposition: A | Discharge status: Home / Self Care | Payer: BC Managed Care – PPO | Source: Ambulatory Visit | Attending: Internal Medicine | Admitting: Internal Medicine

## 2017-12-14 DIAGNOSIS — R928 Other abnormal and inconclusive findings on diagnostic imaging of breast: Secondary | ICD-10-CM

## 2018-06-08 ENCOUNTER — Ambulatory Visit: Payer: Self-pay

## 2018-06-08 NOTE — Telephone Encounter (Signed)
Patient called, unable to leave message due to mailbox is full, as per recording.  Message from Maia Petties sent at 06/08/2018 12:07 PM EST   Summary: sinus congestion/green mucus   Pt is having sinus congestion and green mucus coming out. Left eye is a little itchy and seems it is trying to turn pink. No appts available with [place]. Pt asking for advice.

## 2018-06-08 NOTE — Telephone Encounter (Signed)
Returned call to patient. Mailbox is full. Unable to leave message °

## 2018-06-08 NOTE — Telephone Encounter (Signed)
Outgoing call to Patient  Who complains of sinus congestions, left eye is a little green.  Mucous blowing put is a little green.  Sx started a week ago.  Reports sinus pressure .  Pain is rated Moderate. States left eye is starting to turn pink.  Due to hour of the day reccommended that Patient be evaluateds at Urgent care.   Patient voiced understanding.  Provided care advise voiced understanding.   Reason for Disposition . [1] Taking antibiotic > 48 hours (2 days) AND [2] fever persists  Answer Assessment - Initial Assessment Questions 1. LOCATION: "Where does it hurt?"      Eyes nasal cavitie   Nasal pressur about a weak 2. ONSET: "When did the sinus pain start?"  (e.g., hours, days)      A week ago 3. SEVERITY: "How bad is the pain?"   (Scale 1-10; mild, moderate or severe)   - MILD (1-3): doesn't interfere with normal activities    - MODERATE (4-7): interferes with normal activities (e.g., work or school) or awakens from sleep   - SEVERE (8-10): excruciating pain and patient unable to do any normal activities         moderateCURRENT SYMPTOM: "Have you ever had sinus problems before?" If so, ask: "When was the last time?" and "What happened that time?"      modersate 5. NASAL CONGESTION: "Is the nose blocked?" If so, ask, "Can you open it or must you breathe through the mouth?"     congested6. NASAL DISCHARGE: "Do you have discharge from your nose?" If so ask, "What color?"      Mucous is green 7. FEVER: "Do you have a fever?" If so, ask: "What is it, how was it measured, and when did it start?"      No fever 8. OTHER SYMPTOMS: "Do you have any other symptoms?" (e.g., sore throat, cough, earache, difficulty breathing)     eyeys pinks eye 9. PREGNANCY: "Is there any chance you are pregnant?" "When was your last menstrual period?"     *No Answer*  Protocols used: SINUS INFECTION ON ANTIBIOTIC FOLLOW-UP CALL-A-AH, SINUS PAIN OR CONGESTION-A-AH

## 2019-01-09 ENCOUNTER — Other Ambulatory Visit: Payer: Self-pay | Admitting: Internal Medicine

## 2019-01-09 DIAGNOSIS — Z1231 Encounter for screening mammogram for malignant neoplasm of breast: Secondary | ICD-10-CM

## 2019-01-14 ENCOUNTER — Other Ambulatory Visit: Payer: Self-pay

## 2019-01-14 ENCOUNTER — Ambulatory Visit
Admission: RE | Admit: 2019-01-14 | Discharge: 2019-01-14 | Disposition: A | Discharge status: Home / Self Care | Payer: BC Managed Care – PPO | Source: Ambulatory Visit | Attending: Internal Medicine | Admitting: Internal Medicine

## 2019-01-14 DIAGNOSIS — Z1231 Encounter for screening mammogram for malignant neoplasm of breast: Secondary | ICD-10-CM

## 2019-07-01 ENCOUNTER — Ambulatory Visit: Payer: BC Managed Care – PPO | Attending: Internal Medicine

## 2019-07-01 DIAGNOSIS — Z20822 Contact with and (suspected) exposure to covid-19: Secondary | ICD-10-CM

## 2019-07-02 LAB — NOVEL CORONAVIRUS, NAA: SARS-CoV-2, NAA: DETECTED — AB

## 2019-07-02 NOTE — Progress Notes (Signed)
Your test for COVID-19 was positive ("detected"), meaning that you were infected with the novel coronavirus and could give the germ to others.    Please continue isolation at home, for at least 10 days since the start of your fever/cough/breathlessness and until you have had 24 hours without fever (without taking a fever reducer) and with any cough/breathlessness improving. Use over-the-counter medications for symptoms.  If you have had no symptoms, but were exposed to someone who was positive for COVID-19, you will need to quarantine and self-isolate for 14 days from the date of exposure.    Please continue good preventive care measures, including:  frequent hand-washing, avoid touching your face, cover coughs/sneezes, stay out of crowds and keep a 6 foot distance from others.  Clean hard surfaces touched frequently with disinfectant cleaning products.   Please check in with your primary care provider about your positive test result.  Go to the nearest urgent care or ED for assessment if you have severe breathlessness or severe weakness/fatigue (ex needing new help getting out of bed or to the bathroom).  Members of your household will also need to quarantine for 14 days from the date of your positive test.You may also be contacted by the health department for follow up. Please call [facility] at [phone] if you have any questions or concerns.     

## 2019-07-19 ENCOUNTER — Ambulatory Visit: Payer: BC Managed Care – PPO

## 2019-08-03 ENCOUNTER — Ambulatory Visit: Payer: BC Managed Care – PPO

## 2019-08-19 ENCOUNTER — Ambulatory Visit: Payer: BC Managed Care – PPO | Attending: Internal Medicine

## 2019-08-19 DIAGNOSIS — Z23 Encounter for immunization: Secondary | ICD-10-CM

## 2019-08-19 NOTE — Progress Notes (Signed)
   Covid-19 Vaccination Clinic  Name:  Salena Ortlieb    MRN: [id number] DOB: Oct 11, 1970  08/19/2019  Ms. Land-Deans was observed post Covid-19 immunization for 15 minutes without incident. She was provided with Vaccine Information Sheet and instruction to access the V-Safe system.   Ms. Colunga was instructed to call 911 with any severe reactions post vaccine: Marland Kitchen Difficulty breathing  . Swelling of face and throat  . A fast heartbeat  . A bad rash all over body  . Dizziness and weakness   Immunizations Administered    Name Date Dose VIS Date Route   Pfizer COVID-19 Vaccine 08/19/2019  9:22 AM 0.3 mL 05/17/2019 Intramuscular   Manufacturer: [employer], [employer]   Lot: [id number]   NDC: [address]-4

## 2019-09-10 ENCOUNTER — Ambulatory Visit: Payer: BC Managed Care – PPO | Attending: Internal Medicine

## 2019-09-10 ENCOUNTER — Ambulatory Visit: Payer: BC Managed Care – PPO

## 2019-09-10 DIAGNOSIS — Z23 Encounter for immunization: Secondary | ICD-10-CM

## 2019-09-10 NOTE — Progress Notes (Signed)
   Covid-19 Vaccination Clinic  Name:  Genoveva Singleton    MRN: [id number] DOB: 12/11/1970  09/10/2019  Ms. Land-Deans was observed post Covid-19 immunization for 15 minutes without incident. She was provided with Vaccine Information Sheet and instruction to access the V-Safe system.   Ms. W[place]ole was instructed to call 911 with any severe reactions post vaccine: Marland Kitchen Difficulty breathing  . Swelling of face and throat  . A fast heartbeat  . A bad rash all over body  . Dizziness and weakness   Immunizations Administered    Name Date Dose VIS Date Route   Pfizer COVID-19 Vaccine 09/10/2019  4:44 PM 0.3 mL 05/17/2019 Intramuscular   Manufacturer: [employer], [employer]   Lot: [id number]   NDC: [address]-4

## 2021-03-17 ENCOUNTER — Emergency Department (HOSPITAL_COMMUNITY): Payer: BC Managed Care – PPO

## 2021-03-17 ENCOUNTER — Emergency Department (HOSPITAL_COMMUNITY)
Admission: EM | Admit: 2021-03-17 | Discharge: 2021-03-17 | Disposition: A | Discharge status: Home / Self Care | Payer: BC Managed Care – PPO | Attending: Emergency Medicine | Admitting: Emergency Medicine

## 2021-03-17 ENCOUNTER — Encounter (HOSPITAL_COMMUNITY): Payer: Self-pay | Admitting: Emergency Medicine

## 2021-03-17 DIAGNOSIS — R0602 Shortness of breath: Secondary | ICD-10-CM | POA: Diagnosis not present

## 2021-03-17 DIAGNOSIS — R079 Chest pain, unspecified: Secondary | ICD-10-CM | POA: Diagnosis not present

## 2021-03-17 DIAGNOSIS — J45909 Unspecified asthma, uncomplicated: Secondary | ICD-10-CM | POA: Diagnosis not present

## 2021-03-17 LAB — URINALYSIS, ROUTINE W REFLEX MICROSCOPIC
Bilirubin Urine: NEGATIVE
Glucose, UA: NEGATIVE mg/dL
Hgb urine dipstick: NEGATIVE
Ketones, ur: NEGATIVE mg/dL
Leukocytes,Ua: NEGATIVE
Nitrite: NEGATIVE
Protein, ur: NEGATIVE mg/dL
Specific Gravity, Urine: 1.018 (ref 1.005–1.030)
pH: 5 (ref 5.0–8.0)

## 2021-03-17 LAB — CBC WITH DIFFERENTIAL/PLATELET
Abs Immature Granulocytes: 0.02 10*3/uL (ref 0.00–0.07)
Basophils Absolute: 0 10*3/uL (ref 0.0–0.1)
Basophils Relative: 1 %
Eosinophils Absolute: 0.2 10*3/uL (ref 0.0–0.5)
Eosinophils Relative: 3 %
HCT: 37.9 % (ref 36.0–46.0)
Hemoglobin: 12.4 g/dL (ref 12.0–15.0)
Immature Granulocytes: 0 %
Lymphocytes Relative: 29 %
Lymphs Abs: 1.8 10*3/uL (ref 0.7–4.0)
MCH: 29.9 pg (ref 26.0–34.0)
MCHC: 32.7 g/dL (ref 30.0–36.0)
MCV: 91.3 fL (ref 80.0–100.0)
Monocytes Absolute: 0.5 10*3/uL (ref 0.1–1.0)
Monocytes Relative: 8 %
Neutro Abs: 3.7 10*3/uL (ref 1.7–7.7)
Neutrophils Relative %: 59 %
Platelets: 236 10*3/uL (ref 150–400)
RBC: 4.15 MIL/uL (ref 3.87–5.11)
RDW: 14.3 % (ref 11.5–15.5)
WBC: 6.3 10*3/uL (ref 4.0–10.5)
nRBC: 0 % (ref 0.0–0.2)

## 2021-03-17 LAB — COMPREHENSIVE METABOLIC PANEL
ALT: 13 U/L (ref 0–44)
AST: 15 U/L (ref 15–41)
Albumin: 3.6 g/dL (ref 3.5–5.0)
Alkaline Phosphatase: 62 U/L (ref 38–126)
Anion gap: 8 (ref 5–15)
BUN: 9 mg/dL (ref 6–20)
CO2: 24 mmol/L (ref 22–32)
Calcium: 10 mg/dL (ref 8.9–10.3)
Chloride: 106 mmol/L (ref 98–111)
Creatinine, Ser: 0.9 mg/dL (ref 0.44–1.00)
GFR, Estimated: >60 mL/min (ref >60)
Glucose, Bld: 98 mg/dL (ref 70–99)
Potassium: 4.1 mmol/L (ref 3.5–5.1)
Sodium: 138 mmol/L (ref 135–145)
Total Bilirubin: 0.6 mg/dL (ref 0.3–1.2)
Total Protein: 6.7 g/dL (ref 6.5–8.1)

## 2021-03-17 LAB — TROPONIN I (HIGH SENSITIVITY)
Troponin I (High Sensitivity): 3 ng/L (ref <18)
Troponin I (High Sensitivity): 4 ng/L (ref <18)

## 2021-03-17 LAB — PREGNANCY, URINE: Preg Test, Ur: NEGATIVE

## 2021-03-17 NOTE — ED Triage Notes (Signed)
Pt arrives via EMS with weakness and possible chest pain. Pt reports driving to work and had sudden weakness around 830 today. Pt was also having a fullness sensation in her chest and took 324 ASA. No pain on arrival and weakness has improved.

## 2021-03-17 NOTE — ED Provider Notes (Signed)
Emergency Medicine Provider Triage Evaluation Note  Kim Webb , a 50 y.o. female  was evaluated in triage.  Pt complains of shortness of breath and chest pain.  Happened acutely when she was driving to work, she felt tired and has a tightness in her chest.  Also felt short of breath, she felt lightheaded.  Had to sit down with taking an order at [facility] which is atypical for her.    Review of Systems  Positive: Lightheaded, chest tightness, shortness of breath Negative: No lateralized weakness, no headache, vomiting, nausea.  Physical Exam  BP 123/75 (BP Location: Right Arm)   Pulse 63   Resp 18   Ht 5\' 7"  (1.702 m)   SpO2 98%   BMI 39.19 kg/m  Gen:   Awake, no distress   Resp:  Normal effort  MSK:   Moves extremities without difficulty  Other:  S1-S2, lungs clear to auscultation bilaterally.   Medical Decision Making  Medically screening exam initiated at 12:09 PM.  Appropriate orders placed.  [place] was informed that the remainder of the evaluation will be completed by another provider, this initial triage assessment does not replace that evaluation, and the importance of remaining in the ED until their evaluation is complete.  Chest pain work-up. Not on oral birth control, no recent surgeries, no recent travel.  Not tachycardic, doubt PE   Overton Mam, Theron Arista 03/17/21 1210    05/17/21, MD 03/17/21 1328

## 2021-03-17 NOTE — ED Provider Notes (Signed)
[facility] EMERGENCY DEPARTMENT Provider Note   CSN: [id number] Arrival date & time: 03/17/21  1144     History Chief Complaint  Patient presents with   Chest Pain    Kim Webb is a 50 y.o. female.  with past medical history of anemia, PCOS who presents to the emergency department with shortness of breath.  States that this morning she felt generally fatigued and she was driving to work when she had a sudden onset of "warmth" in the center of her chest.  States that she then began feeling short of breath, and then had a tight, non-radiating central chest pain.  States that the entire episode lasted about an hour.  She then arrived at work, still felt somewhat lightheaded and short of breath so went to the office secretary who took her blood pressure.  States that her blood pressure was 170 systolic.  They called EMS and she presented to the emergency department.  She denies syncope, palpitations, nausea or vomiting, diaphoresis, fevers, cough, recent illnesses, use of birth control, recent travel or surgeries.  Denies excess caffeine use.  She does note that she has been stressed over the past few months taking care of her mother who has severe dementia, she is only apparent it is working multiple jobs.   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   HEMOCCULT POSITIVE STOOL 09/24/2008   ANXIETY 09/22/2008   DEPRESSION 09/22/2008   BACK PAIN, LUMBAR 09/22/2008    Past Surgical History:  Procedure Laterality Date   HYSTEROSCOPY     with polyp excision     OB History     Gravida  1   Para  1   Term  1   Preterm  0   AB  0   Living  1       SAB  0   IAB  0   Ectopic  0   Multiple  0   Live Births  1           Family History  Problem Relation Age of Onset   Prostate cancer Father    Diabetes Father    Hypertension Father    Heart disease Father    Prostate cancer Maternal Grandfather    Hypertension Paternal Grandmother    CVA Paternal Grandmother    Cancer Paternal Grandfather    Breast cancer Maternal Aunt 70    Social History   Tobacco Use   Smoking status: Never   Smokeless tobacco: Never  Substance Use Topics   Alcohol use: No   Drug use: No    Home Medications Prior to Admission medications   Medication Sig Start Date End Date Taking? Authorizing Provider  albuterol (PROVENTIL HFA;VENTOLIN HFA) 108 (90 Base) MCG/ACT inhaler Inhale into the lungs daily as needed. 05/13/15   [provider]  amoxicillin (AMOXIL) 500 MG capsule Take 1 capsule (500 mg total) by mouth 3 (three) times daily. 10/31/17   Shade Flood, MD  Lactobacillus (PROBIOTIC ACIDOPHILUS PO) Take by mouth.    [provider]  ranitidine (ZANTAC) 150 MG tablet Take 150 mg by mouth  daily.    [provider]    Allergies    Prednisone  Review of Systems   Review of Systems  Constitutional:  Negative for fever.  Respiratory:  Positive for chest tightness and shortness of breath. Negative for cough.   Cardiovascular:  Positive for chest pain.  Gastrointestinal:  Negative for abdominal pain.  Neurological:  Positive for light-headedness. Negative for syncope and headaches.  All other systems reviewed and are negative.  Physical Exam Updated Vital Signs BP 128/70   Pulse 75   Resp 16   Ht 5\' 7"  (1.702 m)   SpO2 98%   BMI 39.19 kg/m   Physical Exam Vitals and nursing note reviewed.  Constitutional:      General: She is not in acute distress.    Appearance: Normal appearance. She is well-developed. She is not toxic-appearing.  HENT:     Head: Normocephalic.  Eyes:     General: No  scleral icterus.    Pupils: Pupils are equal, round, and reactive to light.  Neck:     Vascular: No JVD.  Cardiovascular:     Rate and Rhythm: Normal rate and regular rhythm.     Pulses:          Radial pulses are 2+ on the right side and 2+ on the left side.       Dorsalis pedis pulses are 2+ on the right side and 2+ on the left side.     Heart sounds: Normal heart sounds. No murmur heard. Pulmonary:     Effort: Pulmonary effort is normal. No tachypnea or respiratory distress.     Breath sounds: Normal breath sounds.  Chest:     Chest wall: No tenderness.  Abdominal:     General: Bowel sounds are normal.     Palpations: Abdomen is soft.  Musculoskeletal:     Cervical back: Normal range of motion and neck supple.     Right lower leg: No tenderness. No edema.     Left lower leg: No tenderness. No edema.  Skin:    General: Skin is warm and dry.     Capillary Refill: Capillary refill takes less than 2 seconds.  Neurological:     General: No focal deficit present.     Mental Status: She is alert and oriented to person, place, and time.  Psychiatric:        Mood and Affect: Mood normal.        Behavior: Behavior normal.    ED Results / Procedures / Treatments   Labs (all labs ordered are listed, but only abnormal results are displayed) Labs Reviewed  COMPREHENSIVE METABOLIC PANEL  CBC WITH DIFFERENTIAL/PLATELET  URINALYSIS, ROUTINE W REFLEX MICROSCOPIC  PREGNANCY, URINE  TROPONIN I (HIGH SENSITIVITY)  TROPONIN I (HIGH SENSITIVITY)    EKG EKG Interpretation  Date/Time:  Wednesday March 17 2021 11:47:51 EDT Ventricular Rate:  69 PR Interval:  138 QRS Duration: 84 QT Interval:  388 QTC Calculation: 415 R Axis:   22 Text Interpretation: Normal sinus rhythm Normal ECG Confirmed by [id number] [phone]) on 03/17/2021 2:40:33 PM  Radiology DG Chest 2 View  Result Date: 03/17/2021 CLINICAL DATA:  Chest pain and burning.  Left shoulder pain EXAM: CHEST - 2 VIEW  COMPARISON:  09/02/2015 FINDINGS: The heart size and mediastinal contours are within normal limits. Both lungs are clear. The visualized skeletal structures are unremarkable. IMPRESSION: No active cardiopulmonary disease. Electronically Signed   By: 09/04/2015 M.D.   On:  03/17/2021 13:07    Procedures Procedures   Medications Ordered in ED Medications - No data to display  ED Course  I have reviewed the triage vital signs and the nursing notes.  Pertinent labs & imaging results that were available during my care of the patient were reviewed by me and considered in my medical decision making (see chart for details).  HEAR Score: 1   MDM Rules/Calculators/A&P 50 year old female who presents emergency department with shortness of breath and chest pain.  Unclear etiology of chest pain at this time however patient has had reassuring work-up here in the emergency department. Not pregnant Electrolytes within normal limits Not anemic Troponin 3--> 4, delta 1. Doubt ACS as EKG is without ischemia or infarction, troponin is negative. Chest x-ray without pneumothorax. No leukocytosis, infiltrate on x-ray, no infectious symptoms concerning for pneumonia. Presentations not consistent with dissection. Wells criteria 0, PERC 1 due to age however her presentation is not consistent with pulmonary embolism.  She has never been hypoxic here in the emergency department, no risk factors, not tachycardic or tachypneic.  At this time, patient is stable for discharge with PCP follow-up.  Final Clinical Impression(s) / ED Diagnoses Final diagnoses:  Nonspecific chest pain    Rx / DC Orders ED Discharge Orders     None        Cristopher Peru, PA-C 03/17/21 1538    Margarita Grizzle, MD 03/18/21 1606

## 2021-03-17 NOTE — Discharge Instructions (Addendum)
You were seen in the emergency department today for chest pain and shortness of breath.  Your work-up here in the emergency department has been reassuring that you are not having a heart attack or other serious event.  It would be prudent to follow-up with your primary care provider in a week to discuss your blood pressure as you said it was 170 for the top number at your work.  While you were here in the emergency department your blood pressure was normal with top number being in the 120s.  We discussed that you have been stressed taking care of your family and son and working multiple jobs.  I have written you a work note to try and rest over the next few days.  Please was present to the emergency department with recurrence of your chest pain shortness of breath, nausea, diaphoresis or any other concerns.

## 2021-04-26 ENCOUNTER — Other Ambulatory Visit: Payer: Self-pay | Admitting: Internal Medicine

## 2021-04-26 DIAGNOSIS — Z1231 Encounter for screening mammogram for malignant neoplasm of breast: Secondary | ICD-10-CM

## 2021-05-26 ENCOUNTER — Ambulatory Visit
Admission: RE | Admit: 2021-05-26 | Discharge: 2021-05-26 | Disposition: A | Discharge status: Home / Self Care | Payer: BC Managed Care – PPO | Source: Ambulatory Visit | Attending: Internal Medicine | Admitting: Internal Medicine

## 2021-05-26 DIAGNOSIS — Z1231 Encounter for screening mammogram for malignant neoplasm of breast: Secondary | ICD-10-CM

## 2021-06-02 ENCOUNTER — Other Ambulatory Visit: Payer: Self-pay | Admitting: Internal Medicine

## 2021-06-02 DIAGNOSIS — R928 Other abnormal and inconclusive findings on diagnostic imaging of breast: Secondary | ICD-10-CM

## 2021-06-30 ENCOUNTER — Other Ambulatory Visit: Payer: BC Managed Care – PPO

## 2021-07-08 ENCOUNTER — Other Ambulatory Visit: Payer: BC Managed Care – PPO

## 2021-07-10 ENCOUNTER — Ambulatory Visit
Admission: RE | Admit: 2021-07-10 | Discharge: 2021-07-10 | Disposition: A | Discharge status: Home / Self Care | Payer: BC Managed Care – PPO | Source: Ambulatory Visit | Attending: Internal Medicine | Admitting: Internal Medicine

## 2021-07-10 ENCOUNTER — Ambulatory Visit: Payer: BC Managed Care – PPO

## 2021-07-10 DIAGNOSIS — R928 Other abnormal and inconclusive findings on diagnostic imaging of breast: Secondary | ICD-10-CM

## 2021-07-19 ENCOUNTER — Other Ambulatory Visit (HOSPITAL_COMMUNITY)
Admission: RE | Admit: 2021-07-19 | Discharge: 2021-07-19 | Disposition: A | Discharge status: Home / Self Care | Payer: BC Managed Care – PPO | Source: Ambulatory Visit | Attending: Obstetrics & Gynecology | Admitting: Obstetrics & Gynecology

## 2021-07-19 ENCOUNTER — Ambulatory Visit (HOSPITAL_BASED_OUTPATIENT_CLINIC_OR_DEPARTMENT_OTHER): Payer: BC Managed Care – PPO | Admitting: Obstetrics & Gynecology

## 2021-07-19 ENCOUNTER — Other Ambulatory Visit: Payer: Self-pay

## 2021-07-19 ENCOUNTER — Encounter (HOSPITAL_BASED_OUTPATIENT_CLINIC_OR_DEPARTMENT_OTHER): Payer: Self-pay | Admitting: Obstetrics & Gynecology

## 2021-07-19 VITALS — BP 141/77 | HR 69 | Ht 67.0 in | Wt 259.6 lb

## 2021-07-19 DIAGNOSIS — Z3009 Encounter for other general counseling and advice on contraception: Secondary | ICD-10-CM

## 2021-07-19 DIAGNOSIS — R768 Other specified abnormal immunological findings in serum: Secondary | ICD-10-CM

## 2021-07-19 DIAGNOSIS — N951 Menopausal and female climacteric states: Secondary | ICD-10-CM

## 2021-07-19 DIAGNOSIS — Z01419 Encounter for gynecological examination (general) (routine) without abnormal findings: Secondary | ICD-10-CM

## 2021-07-19 DIAGNOSIS — Z124 Encounter for screening for malignant neoplasm of cervix: Secondary | ICD-10-CM | POA: Diagnosis present

## 2021-07-19 DIAGNOSIS — Z1211 Encounter for screening for malignant neoplasm of colon: Secondary | ICD-10-CM

## 2021-07-19 NOTE — Progress Notes (Signed)
51 y.o. G17P1001 Legally Separated Black or [place] American female here for annual exam.  She is separated from her husband.  She moved to the [place].  She is still at [employer] but primary is with [facility] system.  She thought they were divorced but are not.  This will be official in early March.  She is not currently SA.    Cycles are still regular but much shorter.        Sexually active: No.  The current method of family planning is abstinence.     Upstream - 07/19/21 1518       Pregnancy Intention Screening   Does the patient want to become pregnant in the next year? No    Does the patient's partner want to become pregnant in the next year? N/A    Would the patient like to discuss contraceptive options today? Yes      Contraception Wrap Up   Current Method Abstinence            Smoker:  no  Health Maintenance: Pap:  obtained today History of abnormal Pap:  no MMG:  05/26/2021, follow up was normal Colonoscopy:  10/10/08.  Willing to do cologuard.  Declines colonoscopy now. Screening Labs: will plan with Dr. Karlton Lemon   reports that she has never smoked. She has never used smokeless tobacco. She reports that she does not drink alcohol and does not use drugs.  Past Medical History:  Diagnosis Date   Anemia    Asthma    Genital warts    PCOS (polycystic ovarian syndrome)     Past Surgical History:  Procedure Laterality Date   HYSTEROSCOPY     with polyp excision    Current Outpatient Medications  Medication Sig Dispense Refill   Cholecalciferol 10 MCG (400 UNIT) CHEW Chew by mouth.     Lactobacillus (PROBIOTIC ACIDOPHILUS PO) Take by mouth.     Travoprost, BAK Free, (TRAVATAN) 0.004 % SOLN ophthalmic solution SMARTSIG:1 Drop(s) In Eye(s) Every Evening     vitamin B-12 (CYANOCOBALAMIN) 500 MCG tablet Take by mouth.     latanoprost (XALATAN) 0.005 % ophthalmic solution 1 drop at bedtime. (Patient not taking: Reported on 07/19/2021)     No current  facility-administered medications for this visit.    Family History  Problem Relation Age of Onset   Cancer Paternal Grandfather    Hypertension Paternal Grandmother    CVA Paternal Grandmother    Prostate cancer Maternal Grandfather    Prostate cancer Father    Diabetes Father    Hypertension Father    Heart disease Father    Dementia Mother    Breast cancer Maternal Aunt 32    Review of Systems  All other systems reviewed and are negative.  Exam:   BP (!) 141/77    Pulse 69    Ht 5\' 7"  (1.702 m)    Wt 259 lb 9.6 oz (117.8 kg)    LMP 06/25/2021    BMI 40.66 kg/m   Height: 5\' 7"  (170.2 cm)  General appearance: alert, cooperative and appears stated age Head: Normocephalic, without obvious abnormality, atraumatic Neck: no adenopathy, supple, symmetrical, trachea midline and thyroid normal to inspection and palpation Lungs: clear to auscultation bilaterally Breasts: normal appearance, no masses or tenderness Heart: regular rate and rhythm Abdomen: soft, non-tender; bowel sounds normal; no masses,  no organomegaly Extremities: extremities normal, atraumatic, no cyanosis or edema Skin: Skin color, texture, turgor normal. No rashes or lesions Lymph nodes:  Cervical, supraclavicular, and axillary nodes normal. No abnormal inguinal nodes palpated Neurologic: Grossly normal   Pelvic: External genitalia:  no lesions              Urethra:  normal appearing urethra with no masses, tenderness or lesions              Bartholins and Skenes: normal                 Vagina: normal appearing vagina with normal color and no discharge, no lesions              Cervix: no lesions              Pap taken: Yes.   Bimanual Exam:  Uterus:  normal size, contour, position, consistency, mobility, non-tender              Adnexa: normal adnexa and no mass, fullness, tenderness               Rectovaginal: Confirms               Anus:  normal sphincter tone, no lesions  Chaperone, Ezekiel Ina, RN, was  present for exam.  Assessment/Plan: 1. Well woman exam with routine gynecological exam - pap and HR HPV obtained today - MMG done 05/2021 - cologuard ordered.  Declines colonoscopy. - lab work with Dr. Karlton Lemon [facility]) in January.  These are not in Epic. - vaccines reviewed/updated  2. Cervical cancer screening - Cytology - PAP( [place])  3. Colon cancer screening - Cologuard  4. Perimenopausal  5. 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 Encounter for other general counseling or advice on contraception - options for contraception discussed.  Pt will do some research.

## 2021-07-19 NOTE — Patient Instructions (Signed)
Phexxi- vaginal gel  Progesterone only pill -- micronor  Plan B

## 2021-07-21 LAB — CYTOLOGY - PAP
Adequacy: ABSENT
Comment: NEGATIVE
Diagnosis: NEGATIVE
High risk HPV: NEGATIVE

## 2021-08-05 ENCOUNTER — Encounter (HOSPITAL_BASED_OUTPATIENT_CLINIC_OR_DEPARTMENT_OTHER): Payer: Self-pay | Admitting: Obstetrics & Gynecology

## 2021-08-05 ENCOUNTER — Other Ambulatory Visit (HOSPITAL_BASED_OUTPATIENT_CLINIC_OR_DEPARTMENT_OTHER): Payer: Self-pay | Admitting: Obstetrics & Gynecology

## 2021-08-05 MED ORDER — VALACYCLOVIR HCL 500 MG PO TABS: 500.0000 mg | ORAL_TABLET | Freq: Every day | ORAL | 12 refills | Status: AC

## 2021-08-05 MED ORDER — NORETHINDRONE 0.35 MG PO TABS: 1.0000 | ORAL_TABLET | Freq: Every day | ORAL | 3 refills | Status: AC

## 2021-08-14 LAB — COLOGUARD: COLOGUARD: NEGATIVE

## 2021-08-16 ENCOUNTER — Encounter (HOSPITAL_BASED_OUTPATIENT_CLINIC_OR_DEPARTMENT_OTHER): Payer: Self-pay | Admitting: Obstetrics & Gynecology

## 2021-12-06 ENCOUNTER — Ambulatory Visit (HOSPITAL_BASED_OUTPATIENT_CLINIC_OR_DEPARTMENT_OTHER): Payer: BC Managed Care – PPO | Admitting: Obstetrics & Gynecology

## 2022-08-10 IMAGING — MG MM DIGITAL DIAGNOSTIC UNILAT*R* W/ TOMO W/ CAD
4 series · 4 of 12 positions shown · non-contrast
Comparison: Previous exams.

CLINICAL DATA: Screening recall for right breast asymmetry.

EXAM:
DIGITAL DIAGNOSTIC UNILATERAL RIGHT MAMMOGRAM WITH TOMOSYNTHESIS AND
CAD
TECHNIQUE: Right digital diagnostic mammography and breast tomosynthesis was
performed. The images were evaluated with computer-aided detection.

[R ML synth-2D (1 of 2)]
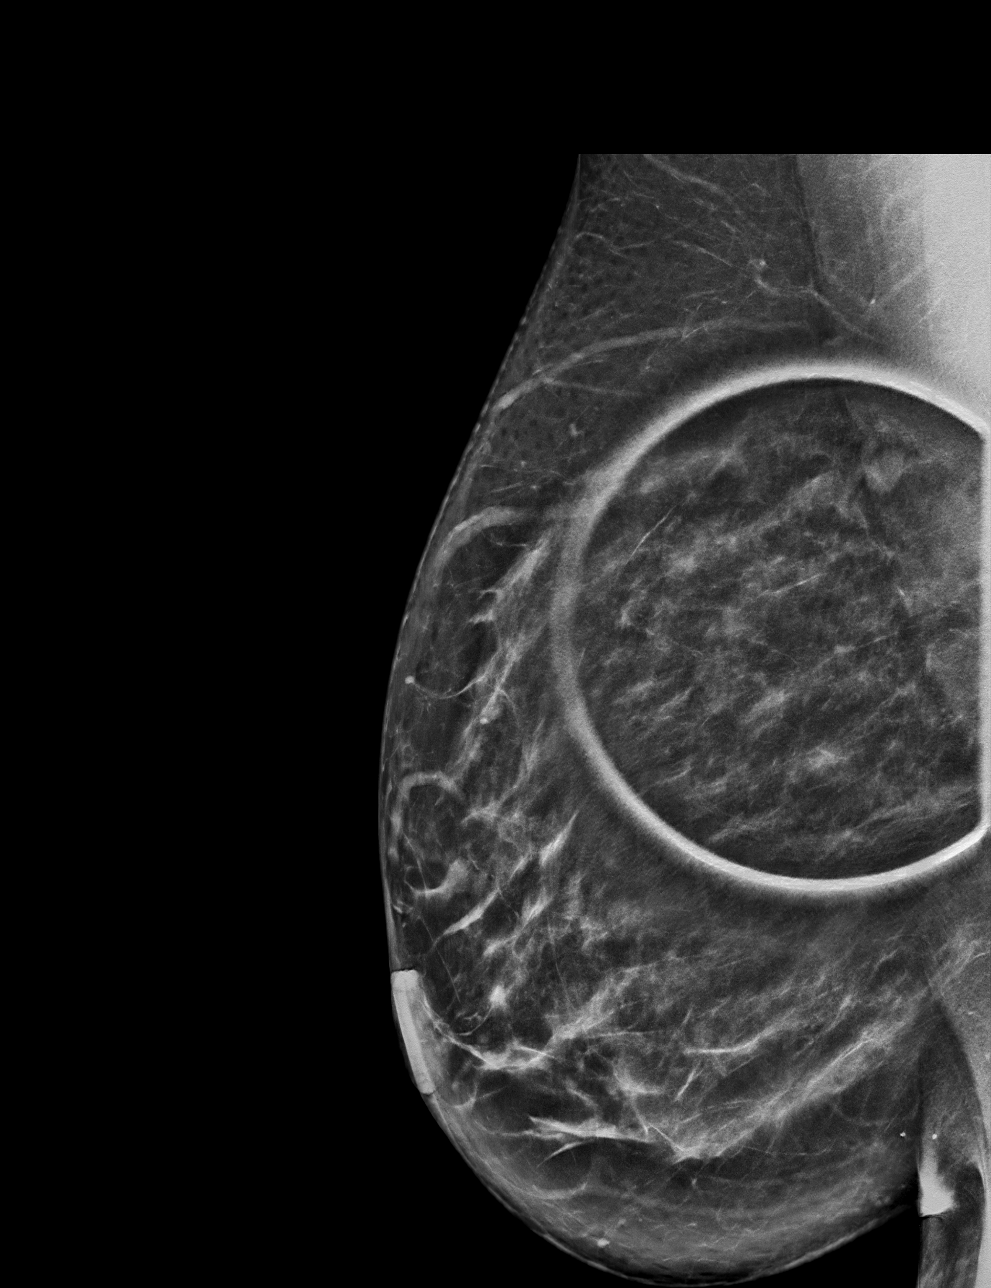

[R ML synth-2D (2 of 2)]
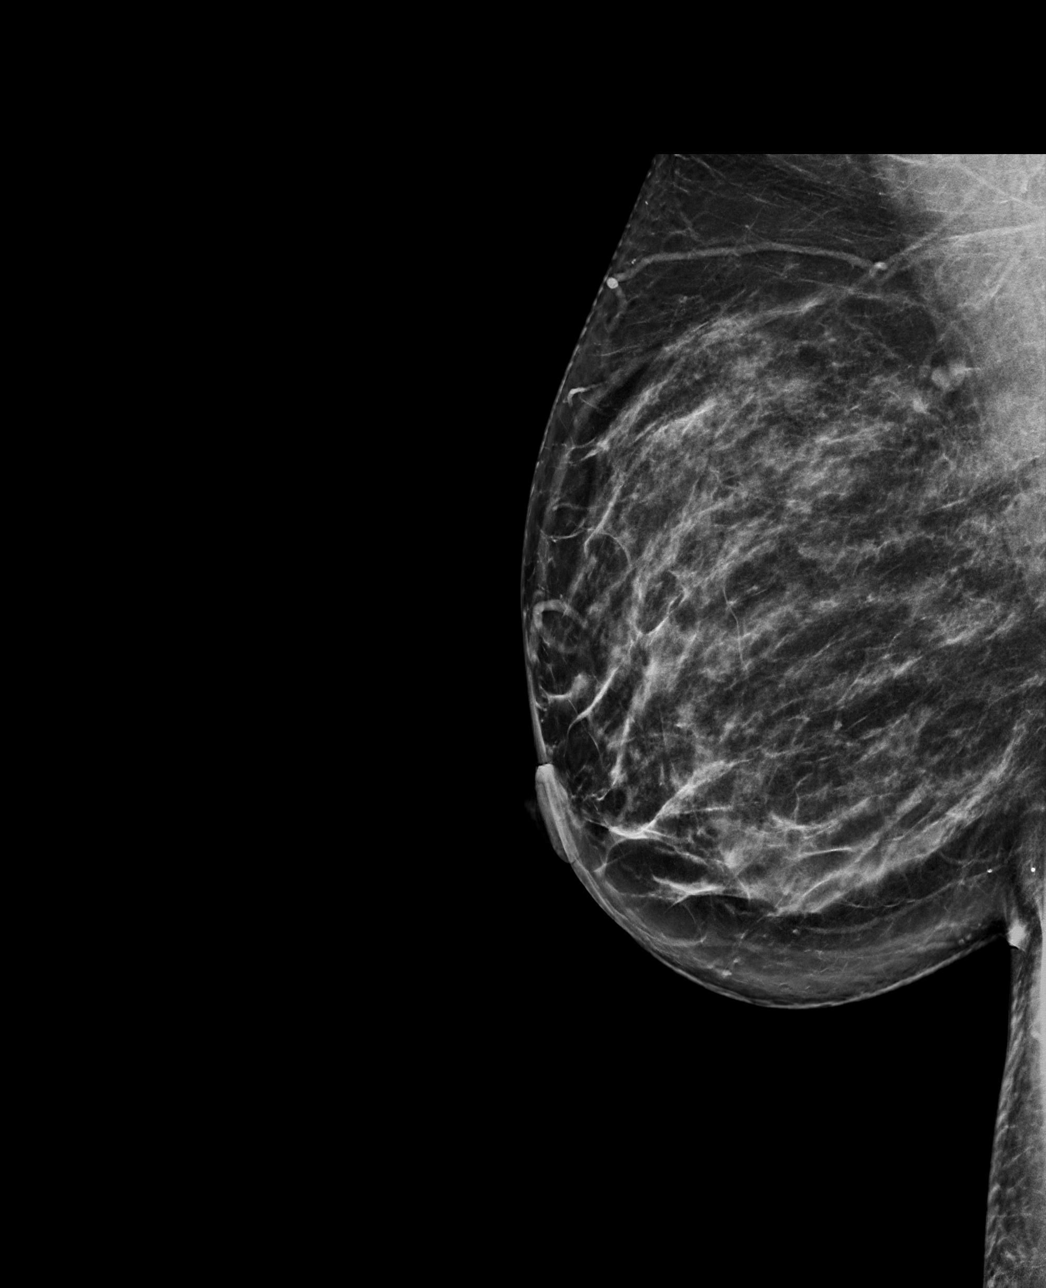

[R ML tomo (1 of 2) · tomo slice 41/80.0]
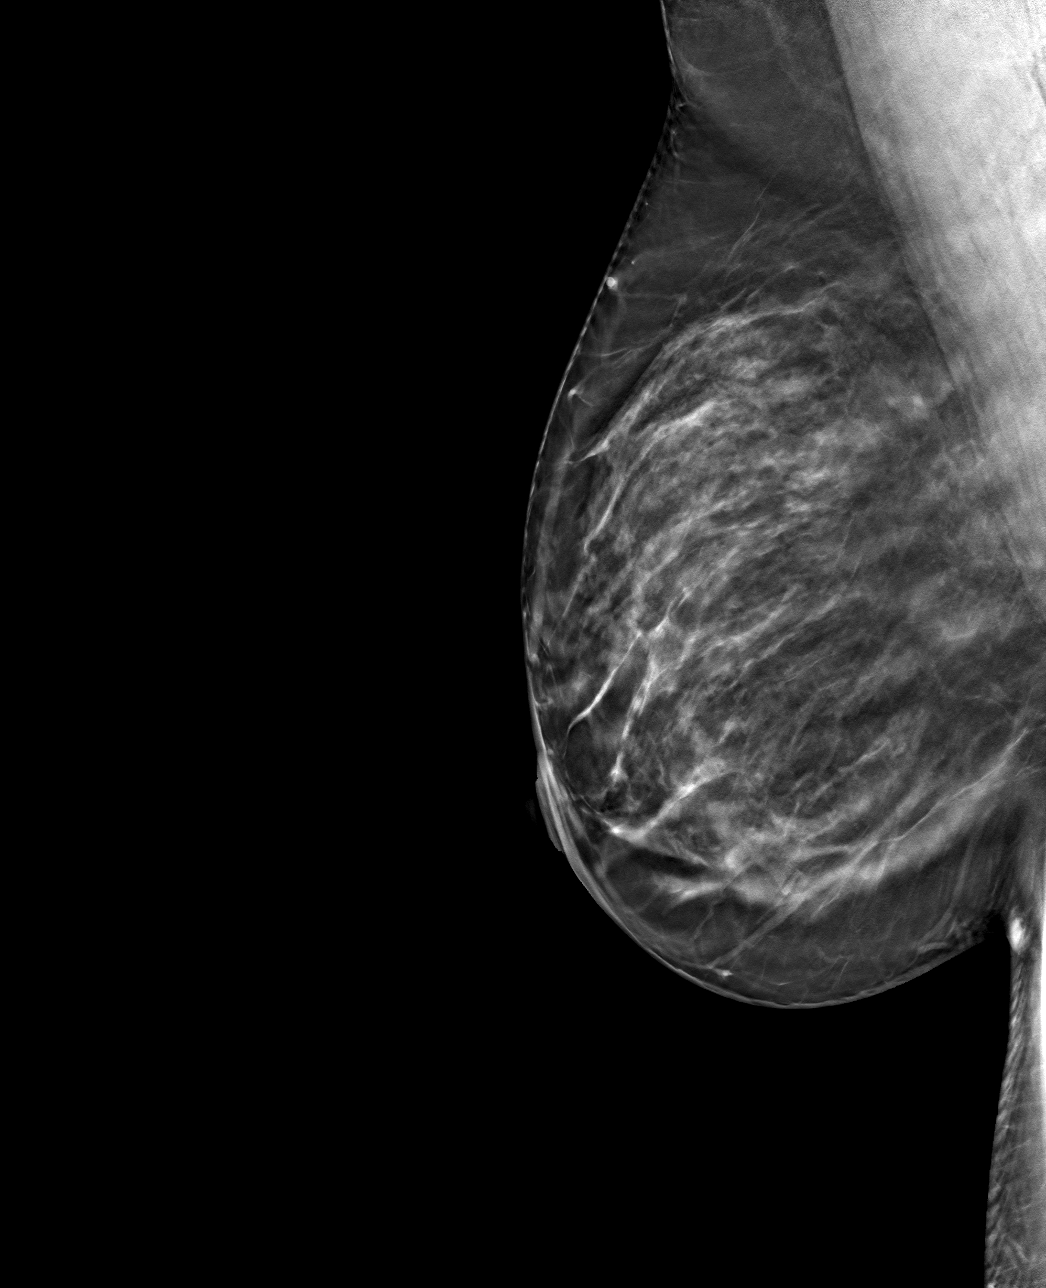

[R ML tomo (2 of 2) · tomo slice 46/91.0]
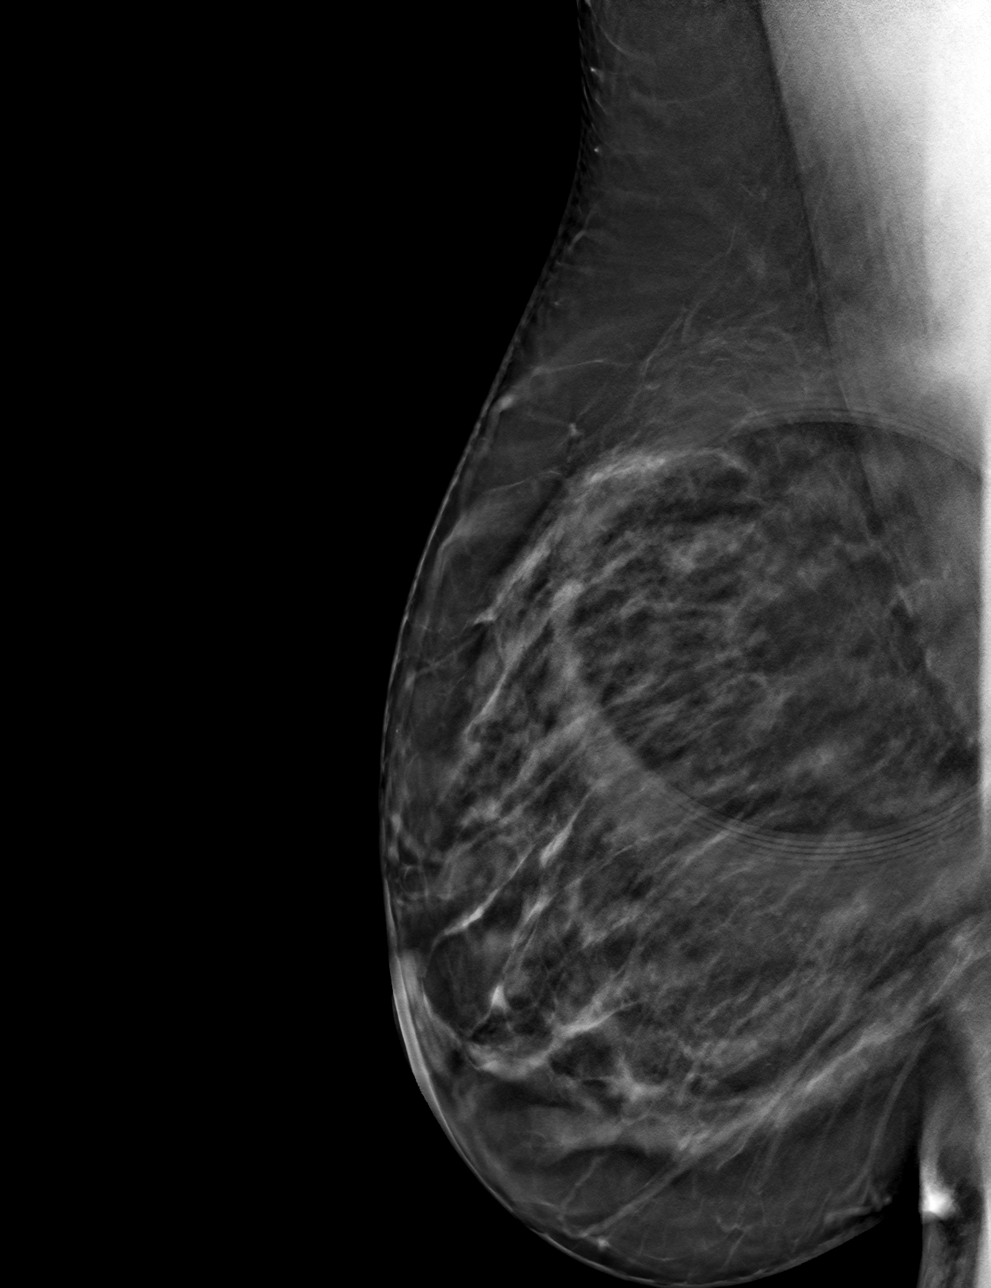

[4 of 12 positions shown; findings below may reference images not displayed]

ACR Breast Density Category c: The breast tissue is heterogeneously
dense, which may obscure small masses.
FINDINGS: Additional tomograms were performed of the right breast. The
initially questioned possible right breast asymmetry resolves on the
additional imaging with findings compatible with an area of
overlapping fibroglandular tissue. There is no mammographic evidence
of malignancy in the right breast.
IMPRESSION: No mammographic evidence of malignancy in the right breast.

RECOMMENDATION:
Screening mammogram in one year.(Code:[id number])

I have discussed the findings and recommendations with the patient.
If applicable, a reminder letter will be sent to the patient
regarding the next appointment.

BI-RADS CATEGORY  1: Negative.

## 2022-11-21 ENCOUNTER — Telehealth (HOSPITAL_BASED_OUTPATIENT_CLINIC_OR_DEPARTMENT_OTHER): Payer: Self-pay | Admitting: *Deleted

## 2022-11-21 ENCOUNTER — Ambulatory Visit (HOSPITAL_BASED_OUTPATIENT_CLINIC_OR_DEPARTMENT_OTHER): Payer: BC Managed Care – PPO | Admitting: Obstetrics & Gynecology

## 2022-11-21 NOTE — Telephone Encounter (Signed)
Patient called stated she needs canceled due to family emergency.

## 2023-06-08 ENCOUNTER — Encounter: Payer: Self-pay | Admitting: Internal Medicine

## 2023-06-08 DIAGNOSIS — Z1231 Encounter for screening mammogram for malignant neoplasm of breast: Secondary | ICD-10-CM
